# Patient Record
Sex: Male | Born: 1955 | Race: White | Hispanic: No | Smoking: Never smoker
Health system: Southern US, Community
[De-identification: ages and names within clinical notes are randomized; demographics above are authoritative.]

## PROBLEM LIST (undated history)

## (undated) DIAGNOSIS — M4800 Spinal stenosis, site unspecified: Secondary | ICD-10-CM

## (undated) DIAGNOSIS — F419 Anxiety disorder, unspecified: Secondary | ICD-10-CM

## (undated) DIAGNOSIS — C19 Malignant neoplasm of rectosigmoid junction: Secondary | ICD-10-CM

## (undated) DIAGNOSIS — E785 Hyperlipidemia, unspecified: Secondary | ICD-10-CM

## (undated) DIAGNOSIS — I10 Essential (primary) hypertension: Secondary | ICD-10-CM

## (undated) DIAGNOSIS — Z8619 Personal history of other infectious and parasitic diseases: Secondary | ICD-10-CM

## (undated) DIAGNOSIS — K6282 Dysplasia of anus: Secondary | ICD-10-CM

## (undated) DIAGNOSIS — F329 Major depressive disorder, single episode, unspecified: Secondary | ICD-10-CM

## (undated) DIAGNOSIS — F32A Depression, unspecified: Secondary | ICD-10-CM

## (undated) DIAGNOSIS — R972 Elevated prostate specific antigen [PSA]: Secondary | ICD-10-CM

## (undated) HISTORY — DX: Elevated prostate specific antigen (PSA): R97.20

## (undated) HISTORY — DX: Anxiety disorder, unspecified: F41.9

## (undated) HISTORY — DX: Depression, unspecified: F32.A

## (undated) HISTORY — DX: Dysplasia of anus: K62.82

## (undated) HISTORY — PX: BACK SURGERY: SHX140

## (undated) HISTORY — DX: Malignant neoplasm of rectosigmoid junction: C19

## (undated) HISTORY — DX: Personal history of other infectious and parasitic diseases: Z86.19

## (undated) HISTORY — DX: Essential (primary) hypertension: I10

## (undated) HISTORY — DX: Spinal stenosis, site unspecified: M48.00

## (undated) HISTORY — PX: HERNIA REPAIR: SHX51

## (undated) HISTORY — PX: COLONOSCOPY: SHX174

## (undated) HISTORY — DX: Hyperlipidemia, unspecified: E78.5

## (undated) HISTORY — PX: RECTAL BIOPSY: SHX2303

## (undated) HISTORY — DX: Major depressive disorder, single episode, unspecified: F32.9

---

## 1975-07-03 HISTORY — PX: TONSILLECTOMY: SUR1361

## 2010-11-17 ENCOUNTER — Other Ambulatory Visit (HOSPITAL_COMMUNITY): Payer: Self-pay | Admitting: Neurological Surgery

## 2010-11-17 DIAGNOSIS — M48061 Spinal stenosis, lumbar region without neurogenic claudication: Secondary | ICD-10-CM

## 2010-12-01 ENCOUNTER — Ambulatory Visit (HOSPITAL_COMMUNITY)
Admission: RE | Admit: 2010-12-01 | Discharge: 2010-12-01 | Disposition: A | Discharge status: Home / Self Care | Payer: BC Managed Care – PPO | Source: Ambulatory Visit | Attending: Neurological Surgery | Admitting: Neurological Surgery

## 2010-12-01 DIAGNOSIS — M48061 Spinal stenosis, lumbar region without neurogenic claudication: Secondary | ICD-10-CM

## 2010-12-01 DIAGNOSIS — M549 Dorsalgia, unspecified: Secondary | ICD-10-CM | POA: Insufficient documentation

## 2010-12-01 DIAGNOSIS — M519 Unspecified thoracic, thoracolumbar and lumbosacral intervertebral disc disorder: Secondary | ICD-10-CM | POA: Insufficient documentation

## 2010-12-01 MED ORDER — IOHEXOL 180 MG/ML  SOLN
14.0000 mL | Freq: Once | INTRAMUSCULAR | Status: AC | PRN
  Administered 2010-12-01: 14 mL via INTRATHECAL

## 2011-01-25 ENCOUNTER — Encounter: Payer: Self-pay | Admitting: *Deleted

## 2011-01-26 ENCOUNTER — Encounter: Payer: Self-pay | Admitting: Internal Medicine

## 2011-01-26 ENCOUNTER — Ambulatory Visit (INDEPENDENT_AMBULATORY_CARE_PROVIDER_SITE_OTHER): Payer: BC Managed Care – PPO | Admitting: Internal Medicine

## 2011-01-26 VITALS — BP 112/78 | HR 72 | Temp 98.6°F | Resp 14 | Ht 69.0 in | Wt 176.0 lb

## 2011-01-26 DIAGNOSIS — M069 Rheumatoid arthritis, unspecified: Secondary | ICD-10-CM

## 2011-01-26 DIAGNOSIS — M479 Spondylosis, unspecified: Secondary | ICD-10-CM

## 2011-01-26 DIAGNOSIS — M79605 Pain in left leg: Secondary | ICD-10-CM | POA: Insufficient documentation

## 2011-01-26 DIAGNOSIS — Z0000 Encounter for general adult medical examination without abnormal findings: Secondary | ICD-10-CM

## 2011-01-26 DIAGNOSIS — M48061 Spinal stenosis, lumbar region without neurogenic claudication: Secondary | ICD-10-CM

## 2011-01-26 DIAGNOSIS — M545 Low back pain: Secondary | ICD-10-CM | POA: Insufficient documentation

## 2011-01-26 DIAGNOSIS — F329 Major depressive disorder, single episode, unspecified: Secondary | ICD-10-CM | POA: Insufficient documentation

## 2011-01-26 DIAGNOSIS — F341 Dysthymic disorder: Secondary | ICD-10-CM

## 2011-01-26 LAB — CBC WITH DIFFERENTIAL/PLATELET
Basophils Relative: 0.4 % (ref 0.0–3.0)
Eosinophils Absolute: 0.1 10*3/uL (ref 0.0–0.7)
MCHC: 33.9 g/dL (ref 30.0–36.0)
MCV: 94.3 fl (ref 78.0–100.0)
Monocytes Absolute: 0.4 10*3/uL (ref 0.1–1.0)
Neutrophils Relative %: 54.7 % (ref 43.0–77.0)
Platelets: 152 10*3/uL (ref 150.0–400.0)
RBC: 4.68 Mil/uL (ref 4.22–5.81)

## 2011-01-26 LAB — POCT URINALYSIS DIPSTICK
Bilirubin, UA: NEGATIVE
Ketones, UA: NEGATIVE
Leukocytes, UA: NEGATIVE
Spec Grav, UA: 1.01
pH, UA: 7

## 2011-01-26 LAB — BASIC METABOLIC PANEL
BUN: 14 mg/dL (ref 6–23)
CO2: 29 mEq/L (ref 19–32)
Chloride: 105 mEq/L (ref 96–112)
Creatinine, Ser: 0.7 mg/dL (ref 0.4–1.5)
Glucose, Bld: 77 mg/dL (ref 70–99)

## 2011-01-26 LAB — HEPATIC FUNCTION PANEL
AST: 18 U/L (ref 0–37)
Albumin: 4.5 g/dL (ref 3.5–5.2)
Alkaline Phosphatase: 63 U/L (ref 39–117)
Bilirubin, Direct: 0.1 mg/dL (ref 0.0–0.3)
Total Protein: 7.3 g/dL (ref 6.0–8.3)

## 2011-01-26 LAB — LIPID PANEL
Total CHOL/HDL Ratio: 3
Triglycerides: 97 mg/dL (ref 0.0–149.0)

## 2011-01-26 NOTE — Progress Notes (Signed)
Subjective:    Patient ID: Eric Dine., male    DOB: 01-27-1956, 55 y.o.   MRN: [id number]  HPI The patient is a 55 year old white male who presents to establish longitudinal internal medicine care. He lays a recent history of treatment for anxiety with depression approximately a year ago he began treatment with Cymbalta for stress from a move in a job change.  He came to [place] to give care for his parents his father is recently deceased and he is still adjusting to the location and his caregiver role.  He has a more extensive history of low back pain with a working diagnosis of spinal stenosis.  Apparently he has seen both a neurosurgeon and a neurologist at [facility] as well as a local neurosurgeon Dr. Danielle Dess. At his evaluation they discussed that he had severe arthritis of the spine and lumbar region with spinal stenosis it was not considered operative he failed to nerve blocks and there was a discussion of a more definitive procedure. He has a distant history of rheumatoid arthritis and no followup after a diagnosis approximately 4 years ago.     Review of Systems  Constitutional: Negative for fever and fatigue.  HENT: Negative for hearing loss, congestion, neck pain and postnasal drip.   Eyes: Negative for discharge, redness and visual disturbance.  Respiratory: Negative for cough, shortness of breath and wheezing.   Cardiovascular: Negative for leg swelling.  Gastrointestinal: Negative for abdominal pain, constipation and abdominal distention.  Genitourinary: Negative for urgency and frequency.  Musculoskeletal: Positive for myalgias, back pain and arthralgias. Negative for joint swelling.  Skin: Negative for color change and rash.  Neurological: Negative for weakness and light-headedness.  Hematological: Negative for adenopathy.  Psychiatric/Behavioral: Negative for behavioral problems.   Past Medical History  Diagnosis Date  . History of chicken pox   . Spinal  stenosis   . Depression     reactive   Past Surgical History  Procedure Date  . Hernia repair   . Tonsillectomy 1977    reports that he has never smoked. He does not have any smokeless tobacco history on file. He reports that he drinks alcohol. His drug history not on file. family history includes Arthritis in his father and mother; Diabetes in his father; Heart disease in his father; Hyperlipidemia in his father; and Hypertension in his father. No Known Allergies     Objective:   Physical Exam  Nursing note and vitals reviewed. Constitutional: He appears well-developed and well-nourished.  HENT:  Head: Normocephalic and atraumatic.  Eyes: Conjunctivae are normal. Pupils are equal, round, and reactive to light.  Neck: Normal range of motion. Neck supple.  Cardiovascular: Normal rate and regular rhythm.   Pulmonary/Chest: Effort normal and breath sounds normal.  Abdominal: Soft. Bowel sounds are normal.          Assessment & Plan:   I am concerned that his back pain may be more complicated than simply osteoarthritis with spinal stenosis given the history of a positive rheumatoid factor that has been on treatment and also the history that he responded to steroid treatment with excellent control of his back pain I am wondering if this may represent a mixed connective tissue or an autoimmune disorder.  To that end we will draw a HLA-B27 and a circulated C-peptide antibody I will review the MRI to see if there is any evidence of ankylosis in his back we'll consider a rheumatological referral.  He will be scheduled for a physical  in approximately one month physical labs were drawn today

## 2011-01-29 LAB — CYCLIC CITRUL PEPTIDE ANTIBODY, IGG: Cyclic Citrullin Peptide Ab: <2 U/mL (ref 0.0–5.0)

## 2011-02-07 ENCOUNTER — Ambulatory Visit (INDEPENDENT_AMBULATORY_CARE_PROVIDER_SITE_OTHER): Payer: BC Managed Care – PPO | Admitting: Internal Medicine

## 2011-02-07 ENCOUNTER — Encounter: Payer: Self-pay | Admitting: Internal Medicine

## 2011-02-07 VITALS — BP 130/78 | HR 80 | Temp 98.2°F | Resp 16 | Ht 69.0 in | Wt 175.0 lb

## 2011-02-07 DIAGNOSIS — Z0000 Encounter for general adult medical examination without abnormal findings: Secondary | ICD-10-CM

## 2011-02-07 DIAGNOSIS — Z23 Encounter for immunization: Secondary | ICD-10-CM

## 2011-02-07 DIAGNOSIS — M549 Dorsalgia, unspecified: Secondary | ICD-10-CM

## 2011-02-07 NOTE — Progress Notes (Signed)
  Subjective:    Patient ID: Eric Parks., male    DOB: [birth year], 55 y.o.   MRN: [id number]  HPI Patient is a 55 year old white male presents for complete physical examination.  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  He has not been treated for rheumatoid arthritis he has been treated with back injections after being seen by a neurologist at [facility] and referral to their back pain center.     Review of Systems  Constitutional: Negative for fever and fatigue.  HENT: Negative for hearing loss, congestion, neck pain and postnasal drip.   Eyes: Negative for discharge, redness and visual disturbance.  Respiratory: Negative for cough, shortness of breath and wheezing.   Cardiovascular: Negative for leg swelling.  Gastrointestinal: Negative for abdominal pain, constipation and abdominal distention.  Genitourinary: Negative for urgency and frequency.  Musculoskeletal: Positive for back pain. Negative for joint swelling and arthralgias.  Skin: Negative for color change and rash.  Neurological: Negative for weakness and light-headedness.  Hematological: Negative for adenopathy.  Psychiatric/Behavioral: Negative for behavioral problems.       Past Medical History  Diagnosis Date  . History of chicken pox   . Spinal stenosis   . Depression     reactive   Past Surgical History  Procedure Date  . Hernia repair   . Tonsillectomy 1977    reports that he has never smoked. He does not have any smokeless tobacco history on file. He reports that he drinks alcohol. His drug history not on file. family history includes Arthritis in his father and mother; Diabetes in his father; Heart disease in his father; Hyperlipidemia in his father; and Hypertension in his father. No Known Allergies  Objective:   Physical Exam  Nursing note and vitals  reviewed. Constitutional: He is oriented to person, place, and time. He appears well-developed and well-nourished.  HENT:  Head: Normocephalic and atraumatic.  Eyes: Conjunctivae are normal. Pupils are equal, round, and reactive to light.  Neck: Normal range of motion. Neck supple.  Cardiovascular: Normal rate and regular rhythm.   Pulmonary/Chest: Effort normal and breath sounds normal.  Abdominal: Soft. Bowel sounds are normal.  Musculoskeletal: Normal range of motion.       Lack of natural curvature of the back with lordosis  Neurological: He is alert and oriented to person, place, and time.  Skin: Skin is warm and dry.          Assessment & Plan:   Patient presents for yearly preventative medicine examination.   all immunizations and health maintenance protocols were reviewed with the patient and they are up to date with these protocols.   screening laboratory values were reviewed with the patient including screening of hyperlipidemia PSA renal function and hepatic function.   There medications past medical history social history problem list and allergies were reviewed in detail.   Goals were established with regard to weight loss exercise diet in compliance with medications   Referral for physical therapy after failure of his paraspinal injections for pain control he does have marked lordosis and bad posture of believe that training and physical therapy will be effective as any medical intervention.

## 2011-02-20 ENCOUNTER — Encounter: Payer: Self-pay | Admitting: Internal Medicine

## 2011-03-06 ENCOUNTER — Encounter: Payer: Self-pay | Admitting: Internal Medicine

## 2011-05-11 ENCOUNTER — Encounter: Payer: Self-pay | Admitting: Internal Medicine

## 2011-05-11 ENCOUNTER — Ambulatory Visit (INDEPENDENT_AMBULATORY_CARE_PROVIDER_SITE_OTHER): Payer: BC Managed Care – PPO | Admitting: Internal Medicine

## 2011-05-11 VITALS — BP 130/80 | HR 76 | Temp 98.2°F | Resp 16 | Ht 69.0 in | Wt 178.0 lb

## 2011-05-11 DIAGNOSIS — F329 Major depressive disorder, single episode, unspecified: Secondary | ICD-10-CM

## 2011-05-11 DIAGNOSIS — M48061 Spinal stenosis, lumbar region without neurogenic claudication: Secondary | ICD-10-CM

## 2011-05-11 DIAGNOSIS — M479 Spondylosis, unspecified: Secondary | ICD-10-CM

## 2011-05-11 DIAGNOSIS — F341 Dysthymic disorder: Secondary | ICD-10-CM

## 2011-05-11 NOTE — Progress Notes (Signed)
  Subjective:    Patient ID: Eric Dine., male    DOB: [birth year], 55 y.o.   MRN: [id number]  HPI Has seen neurosurgeons ( [facility]) and has L4-5 left discuss protrussion Has tried PT without relief at [facility]...  Now has daily pain The surgeon has discussed surgical treatment options    Review of Systems  Constitutional: Negative for fever and fatigue.  HENT: Negative for hearing loss, congestion, neck pain and postnasal drip.   Eyes: Negative for discharge, redness and visual disturbance.  Respiratory: Negative for cough, shortness of breath and wheezing.   Cardiovascular: Negative for leg swelling.  Gastrointestinal: Negative for abdominal pain, constipation and abdominal distention.  Genitourinary: Negative for urgency and frequency.  Musculoskeletal: Positive for back pain and arthralgias. Negative for joint swelling.  Skin: Negative for color change and rash.  Neurological: Negative for weakness and light-headedness.  Hematological: Negative for adenopathy.  Psychiatric/Behavioral: Negative for behavioral problems.       Objective:   Physical Exam  Nursing note and vitals reviewed. Constitutional: He appears well-developed and well-nourished.  HENT:  Head: Normocephalic and atraumatic.  Eyes: Conjunctivae are normal. Pupils are equal, round, and reactive to light.  Neck: Normal range of motion. Neck supple.  Cardiovascular: Normal rate and regular rhythm.   Pulmonary/Chest: Effort normal and breath sounds normal.  Abdominal: Soft. Bowel sounds are normal.          Assessment & Plan:  The pain was in known disc protrusion that has not responded to conservative therapy.  The patient will choose which route to go with neurosurgery either in [facility] or at a [facility]

## 2011-05-11 NOTE — Patient Instructions (Signed)
The patient is instructed to continue all medications as prescribed. Schedule followup with check out clerk upon leaving the clinic  

## 2011-05-11 NOTE — Assessment & Plan Note (Signed)
Agree with surgical diagnosis and would recommend surgery The pt will decide

## 2011-07-09 ENCOUNTER — Telehealth: Payer: Self-pay | Admitting: *Deleted

## 2011-07-09 DIAGNOSIS — M5410 Radiculopathy, site unspecified: Secondary | ICD-10-CM

## 2011-07-09 NOTE — Telephone Encounter (Signed)
Per dr Lovell Sheehan- he has heard she was good

## 2011-07-09 NOTE — Telephone Encounter (Signed)
Needs referral to this MD. [phone]

## 2011-07-09 NOTE — Telephone Encounter (Signed)
Pt. Has heard from a friend that Dr. Stasia Cavalier in [facility] is an excellent neurosurgeon, and wants Dr. Lovell Sheehan opinion.

## 2011-07-09 NOTE — Telephone Encounter (Signed)
May give referral for radicular pain per dr Lovell Sheehan

## 2011-08-10 ENCOUNTER — Ambulatory Visit (INDEPENDENT_AMBULATORY_CARE_PROVIDER_SITE_OTHER): Payer: BC Managed Care – PPO | Admitting: Internal Medicine

## 2011-08-10 ENCOUNTER — Encounter: Payer: Self-pay | Admitting: Internal Medicine

## 2011-08-10 VITALS — BP 130/80 | HR 72 | Temp 98.6°F | Resp 16 | Ht 69.0 in | Wt 180.0 lb

## 2011-08-10 DIAGNOSIS — M545 Low back pain, unspecified: Secondary | ICD-10-CM

## 2011-08-10 DIAGNOSIS — M79605 Pain in left leg: Secondary | ICD-10-CM

## 2011-08-10 DIAGNOSIS — M4800 Spinal stenosis, site unspecified: Secondary | ICD-10-CM

## 2011-08-10 NOTE — Patient Instructions (Signed)
The patient is instructed to continue all medications as prescribed. Schedule followup with check out clerk upon leaving the clinic  

## 2011-08-10 NOTE — Progress Notes (Signed)
  Subjective:    Patient ID: Eric Dine., male    DOB: 01-Aug-1955, 56 y.o.   MRN: [id number]  HPI  Increasing pain  Review of Systems  Constitutional: Negative for fever and fatigue.  HENT: Negative for hearing loss, congestion, neck pain and postnasal drip.   Eyes: Negative for discharge, redness and visual disturbance.  Respiratory: Negative for cough, shortness of breath and wheezing.   Cardiovascular: Negative for leg swelling.  Gastrointestinal: Negative for abdominal pain, constipation and abdominal distention.  Genitourinary: Negative for urgency and frequency.  Musculoskeletal: Negative for joint swelling and arthralgias.  Skin: Negative for color change and rash.  Neurological: Negative for weakness and light-headedness.  Hematological: Negative for adenopathy.  Psychiatric/Behavioral: Negative for behavioral problems.   Past Medical History  Diagnosis Date  . History of chicken pox   . Spinal stenosis   . Depression     reactive    History   Social History  . Marital Status: Single    Spouse Name: N/A    Number of Children: N/A  . Years of Education: N/A   Occupational History  . Clinical research associate    Social History Main Topics  . Smoking status: Never Smoker   . Smokeless tobacco: Not on file  . Alcohol Use: Yes  . Drug Use: Not on file  . Sexually Active: Yes   Other Topics Concern  . Not on file   Social History Narrative  . No narrative on file    Past Surgical History  Procedure Date  . Hernia repair   . Tonsillectomy 1977    Family History  Problem Relation Age of Onset  . Arthritis Father   . Hyperlipidemia Father   . Heart disease Father   . Hypertension Father   . Diabetes Father   . Arthritis Mother     No Known Allergies  Current Outpatient Prescriptions on File Prior to Visit  Medication Sig Dispense Refill  . DULoxetine (CYMBALTA) 60 MG capsule Take 60 mg by mouth daily.          BP 130/80  Pulse 72  Temp 98.6 F (37 C)   Resp 16  Ht 5\' 9"  (1.753 m)  Wt 180 lb (81.647 kg)  BMI 26.58 kg/m2       Objective:   Physical Exam  Nursing note reviewed. Constitutional: He appears well-developed and well-nourished.  HENT:  Head: Normocephalic and atraumatic.  Eyes: Conjunctivae are normal. Pupils are equal, round, and reactive to light.  Neck: Normal range of motion. Neck supple.  Cardiovascular: Normal rate and regular rhythm.   Pulmonary/Chest: Effort normal and breath sounds normal.  Abdominal: Soft. Bowel sounds are normal.          Assessment & Plan:  He is scheduled for February 27 Surgery in [facility].  Hopefully this will relieve his chronic lumbar pain.

## 2011-10-10 ENCOUNTER — Ambulatory Visit: Payer: BC Managed Care – PPO | Admitting: Internal Medicine

## 2011-10-10 ENCOUNTER — Ambulatory Visit: Payer: BC Managed Care – PPO | Attending: Neurosurgery | Admitting: *Deleted

## 2011-12-03 ENCOUNTER — Telehealth: Payer: Self-pay

## 2011-12-03 MED ORDER — DULOXETINE HCL 30 MG PO CPEP: 30.0000 mg | ORAL_CAPSULE | Freq: Every day | ORAL | Status: DC

## 2011-12-03 NOTE — Telephone Encounter (Signed)
Pt has been taking Cymbalta 60mg  for years, but wants to stop it.  He wants to know how to taper off.  Last time he was here he says you discussed with him possibly stopping it since he has been on it for so long that it is probably not as effective as it used to be.

## 2011-12-03 NOTE — Telephone Encounter (Signed)
Per dr Lovell Sheehan- may go on 30 mg for 3 weeks and then stop

## 2012-01-14 ENCOUNTER — Telehealth: Payer: Self-pay | Admitting: Family Medicine

## 2012-01-14 MED ORDER — TRAMADOL HCL 50 MG PO TABS: 50.0000 mg | ORAL_TABLET | Freq: Four times a day (QID) | ORAL | Status: DC | PRN

## 2012-01-14 NOTE — Telephone Encounter (Signed)
Pt had back surgery March 2013 in [place]. Since surgery was in [facility], they state he has been released by their practice, and he should have med management done by Dr. Lovell Sheehan. He was Rx'd Oxycodone by neurosurgeon, as well as Ibuprofen 800mg . One's a narcotic, the other he fears liver damage, so he would like to know if Tramadol a possibility? Non-narcotic, which he prefers. Can he try this on days when pain is particularly bad? Oxy is kind of strong, and leaves him feeling a little "spacy in head". He does not want to take that long term. Please call pt to advise.

## 2012-01-14 NOTE — Telephone Encounter (Signed)
Per dr Lovell Sheehan- may have ultram 50 1-2 q6-8 hrs prn pain

## 2012-03-06 ENCOUNTER — Other Ambulatory Visit: Payer: Self-pay | Admitting: *Deleted

## 2012-03-06 ENCOUNTER — Telehealth: Payer: Self-pay | Admitting: Family Medicine

## 2012-03-06 MED ORDER — TRAMADOL HCL 50 MG PO TABS: 50.0000 mg | ORAL_TABLET | Freq: Four times a day (QID) | ORAL | Status: DC | PRN

## 2012-03-06 NOTE — Telephone Encounter (Signed)
This was taken care of today

## 2012-03-06 NOTE — Telephone Encounter (Signed)
Pt called about Tramadol refill. He is on Tramadol 50mg . He used to get filled at [facility] on [address]. He now will be getting it through [facility] mail order. [facility] had him fill out a form and mail it to our office. Pt mailed it 8/28 and put ATTN Dr. Darryll Capers on the mail, but I don't know if we ever got it. Please send in his Tramadol 50mg  to [facility] mail order. Call pt if there is any issue. He does not take the med every day, just as needed. He has 5 pills left. Thanks.   Pt's ID# for [facility] mail order is:  [id number]

## 2012-04-30 ENCOUNTER — Telehealth: Payer: Self-pay | Admitting: Internal Medicine

## 2012-04-30 NOTE — Telephone Encounter (Signed)
Pt called and has spinal surgery in March 2013. Pt is seeing Dr Charmayne Sheer at [facility] in [facility] re: pain from surgery, and was given some diagnostic shots and has discussed pt going on Enbrel. Pt is req work in ov with Dr Lovell Sheehan to discuss the excruciating and debilitating pain. Pt refused to see another doctor.

## 2012-04-30 NOTE — Telephone Encounter (Signed)
Per dr Lovell Sheehan- needs to see notes from dr Maple Hudson- pt informed

## 2012-05-05 ENCOUNTER — Ambulatory Visit: Payer: BC Managed Care – PPO | Admitting: Internal Medicine

## 2012-05-13 DIAGNOSIS — M961 Postlaminectomy syndrome, not elsewhere classified: Secondary | ICD-10-CM | POA: Insufficient documentation

## 2012-06-01 ENCOUNTER — Other Ambulatory Visit: Payer: Self-pay | Admitting: Internal Medicine

## 2012-07-14 ENCOUNTER — Other Ambulatory Visit: Payer: BC Managed Care – PPO

## 2012-07-15 ENCOUNTER — Other Ambulatory Visit (INDEPENDENT_AMBULATORY_CARE_PROVIDER_SITE_OTHER): Payer: BC Managed Care – PPO

## 2012-07-15 DIAGNOSIS — Z0000 Encounter for general adult medical examination without abnormal findings: Secondary | ICD-10-CM

## 2012-07-15 LAB — POCT URINALYSIS DIPSTICK
Bilirubin, UA: NEGATIVE
Ketones, UA: NEGATIVE
Leukocytes, UA: NEGATIVE

## 2012-07-15 LAB — CBC WITH DIFFERENTIAL/PLATELET
Basophils Absolute: 0 10*3/uL (ref 0.0–0.1)
Basophils Relative: 0.5 % (ref 0.0–3.0)
Eosinophils Absolute: 0.1 10*3/uL (ref 0.0–0.7)
Hemoglobin: 14.2 g/dL (ref 13.0–17.0)
Lymphocytes Relative: 36.3 % (ref 12.0–46.0)
MCHC: 33.9 g/dL (ref 30.0–36.0)
MCV: 93.1 fl (ref 78.0–100.0)
Monocytes Absolute: 0.4 10*3/uL (ref 0.1–1.0)
Neutro Abs: 2.7 10*3/uL (ref 1.4–7.7)
RDW: 13.9 % (ref 11.5–14.6)

## 2012-07-15 LAB — HEPATIC FUNCTION PANEL
Albumin: 4.3 g/dL (ref 3.5–5.2)
Alkaline Phosphatase: 59 U/L (ref 39–117)
Total Protein: 6.9 g/dL (ref 6.0–8.3)

## 2012-07-15 LAB — LIPID PANEL
Cholesterol: 200 mg/dL (ref 0–200)
HDL: 53.2 mg/dL (ref >39.00)
Triglycerides: 155 mg/dL — ABNORMAL HIGH (ref 0.0–149.0)
VLDL: 31 mg/dL (ref 0.0–40.0)

## 2012-07-15 LAB — BASIC METABOLIC PANEL
CO2: 28 mEq/L (ref 19–32)
Calcium: 9.3 mg/dL (ref 8.4–10.5)
Chloride: 105 mEq/L (ref 96–112)
Creatinine, Ser: 0.7 mg/dL (ref 0.4–1.5)
Glucose, Bld: 101 mg/dL — ABNORMAL HIGH (ref 70–99)

## 2012-07-21 ENCOUNTER — Encounter: Payer: Self-pay | Admitting: Internal Medicine

## 2012-07-21 ENCOUNTER — Ambulatory Visit (INDEPENDENT_AMBULATORY_CARE_PROVIDER_SITE_OTHER): Payer: BC Managed Care – PPO | Admitting: Internal Medicine

## 2012-07-21 VITALS — BP 130/76 | HR 72 | Temp 98.2°F | Resp 16 | Ht 69.0 in | Wt 180.0 lb

## 2012-07-21 DIAGNOSIS — M79605 Pain in left leg: Secondary | ICD-10-CM

## 2012-07-21 DIAGNOSIS — M545 Low back pain, unspecified: Secondary | ICD-10-CM

## 2012-07-21 DIAGNOSIS — Z0000 Encounter for general adult medical examination without abnormal findings: Secondary | ICD-10-CM

## 2012-07-21 DIAGNOSIS — M4800 Spinal stenosis, site unspecified: Secondary | ICD-10-CM

## 2012-07-21 DIAGNOSIS — F329 Major depressive disorder, single episode, unspecified: Secondary | ICD-10-CM

## 2012-07-21 DIAGNOSIS — F341 Dysthymic disorder: Secondary | ICD-10-CM

## 2012-07-21 DIAGNOSIS — M533 Sacrococcygeal disorders, not elsewhere classified: Secondary | ICD-10-CM

## 2012-07-21 MED ORDER — METHOTREXATE SODIUM 2.5 MG PO TABS: 7.5000 mg | ORAL_TABLET | ORAL | Status: DC

## 2012-07-21 MED ORDER — TRAMADOL HCL 50 MG PO TABS: 50.0000 mg | ORAL_TABLET | Freq: Three times a day (TID) | ORAL | Status: DC | PRN

## 2012-07-21 NOTE — Progress Notes (Signed)
Subjective:    Patient ID: Eric Parks., male    DOB: 10-06-1955, 57 y.o.   MRN: [id number]  HPI Significant pain in the lower back with radicular pattern Neurology suggested Anklosing Spondylitis     Review of Systems  Constitutional: Negative for fever and fatigue.  HENT: Negative for hearing loss, congestion, neck pain and postnasal drip.   Eyes: Negative for discharge, redness and visual disturbance.  Respiratory: Negative for cough, shortness of breath and wheezing.   Cardiovascular: Negative for leg swelling.  Gastrointestinal: Negative for abdominal pain, constipation and abdominal distention.  Genitourinary: Negative for urgency and frequency.  Musculoskeletal: Positive for back pain, arthralgias and gait problem. Negative for joint swelling.  Skin: Negative for color change and rash.  Neurological: Positive for weakness. Negative for light-headedness.  Hematological: Negative for adenopathy.  Psychiatric/Behavioral: Negative for behavioral problems.   Past Medical History  Diagnosis Date  . History of chicken pox   . Spinal stenosis   . Depression     reactive    History   Social History  . Marital Status: Single    Spouse Name: N/A    Number of Children: N/A  . Years of Education: N/A   Occupational History  . Clinical research associate    Social History Main Topics  . Smoking status: Never Smoker   . Smokeless tobacco: Not on file  . Alcohol Use: Yes  . Drug Use: Not on file  . Sexually Active: Yes   Other Topics Concern  . Not on file   Social History Narrative  . No narrative on file    Past Surgical History  Procedure Date  . Hernia repair   . Tonsillectomy 1977    Family History  Problem Relation Age of Onset  . Arthritis Father   . Hyperlipidemia Father   . Heart disease Father   . Hypertension Father   . Diabetes Father   . Arthritis Mother     No Known Allergies  Current Outpatient Prescriptions on File Prior to Visit  Medication  Sig Dispense Refill  . aspirin 81 MG tablet Take 160 mg by mouth daily.      . traMADol (ULTRAM) 50 MG tablet TAKE 1 TABLET EVERY 6 HOURS AS NEEDED FOR PAIN  50 tablet  0    BP 130/76  Pulse 72  Temp 98.2 F (36.8 C)  Resp 16  Ht 5\' 9"  (1.753 m)  Wt 180 lb (81.647 kg)  BMI 26.58 kg/m2        Objective:   Physical Exam  Nursing note and vitals reviewed. Constitutional: He appears well-developed and well-nourished.  HENT:  Head: Normocephalic and atraumatic.  Eyes: Conjunctivae normal are normal. Pupils are equal, round, and reactive to light.  Neck: Normal range of motion. Neck supple.  Cardiovascular: Normal rate and regular rhythm.   Pulmonary/Chest: Effort normal and breath sounds normal.  Abdominal: Soft. Bowel sounds are normal.  Musculoskeletal: He exhibits tenderness.  Neurological: He displays abnormal reflex. He exhibits abnormal muscle tone. Coordination abnormal.          Assessment & Plan:   Patient presents for yearly preventative medicine examination.   all immunizations and health maintenance protocols were reviewed with the patient and they are up to date with these protocols.   screening laboratory values were reviewed with the patient including screening of hyperlipidemia PSA renal function and hepatic function.   There medications past medical history social history problem list and allergies were reviewed in detail.  Goals were established with regard to weight loss exercise diet in compliance with medications  Discussion of nerve ablation therapy vs embrel injections? Referral to Rheumatology for evaluation Possible AS vs RA Trial of Methotrexate if has response consider Embrel?

## 2012-07-21 NOTE — Patient Instructions (Addendum)
Doing blood work for rheumatoid arthritis and ankylosing spondylitis he can long on to my chart in see the blood work in about 4 or 5 days I have referred you to [facility] for rheumatology

## 2012-07-31 ENCOUNTER — Telehealth: Payer: Self-pay | Admitting: Internal Medicine

## 2012-07-31 ENCOUNTER — Encounter: Payer: Self-pay | Admitting: Internal Medicine

## 2012-07-31 DIAGNOSIS — Z139 Encounter for screening, unspecified: Secondary | ICD-10-CM

## 2012-07-31 NOTE — Telephone Encounter (Signed)
Patient called stating that he would like a referral to have a colonoscopy. Please assist.

## 2012-08-01 ENCOUNTER — Encounter: Payer: Self-pay | Admitting: Gastroenterology

## 2012-08-01 NOTE — Telephone Encounter (Signed)
Ok per Dr Jenkins, referral order placed 

## 2012-08-06 ENCOUNTER — Telehealth: Payer: Self-pay | Admitting: Internal Medicine

## 2012-08-06 NOTE — Telephone Encounter (Signed)
Pt would explanation of blood work results.

## 2012-08-06 NOTE — Telephone Encounter (Signed)
Pt informed per dr Lovell Sheehan- that all was neg ,but that is not a def answer- still wants him to go to [facility]

## 2012-09-04 ENCOUNTER — Encounter: Payer: BC Managed Care – PPO | Admitting: Gastroenterology

## 2012-10-20 ENCOUNTER — Ambulatory Visit (INDEPENDENT_AMBULATORY_CARE_PROVIDER_SITE_OTHER): Payer: BC Managed Care – PPO | Admitting: Internal Medicine

## 2012-10-20 ENCOUNTER — Encounter: Payer: Self-pay | Admitting: Internal Medicine

## 2012-10-20 VITALS — BP 134/74 | HR 76 | Temp 98.3°F | Resp 16 | Ht 69.0 in | Wt 180.0 lb

## 2012-10-20 DIAGNOSIS — F411 Generalized anxiety disorder: Secondary | ICD-10-CM

## 2012-10-20 DIAGNOSIS — F419 Anxiety disorder, unspecified: Secondary | ICD-10-CM

## 2012-10-20 DIAGNOSIS — M48061 Spinal stenosis, lumbar region without neurogenic claudication: Secondary | ICD-10-CM

## 2012-10-20 DIAGNOSIS — M159 Polyosteoarthritis, unspecified: Secondary | ICD-10-CM

## 2012-10-20 MED ORDER — CLORAZEPATE DIPOTASSIUM 7.5 MG PO TABS: 7.5000 mg | ORAL_TABLET | Freq: Two times a day (BID) | ORAL | Status: DC | PRN

## 2012-10-20 NOTE — Patient Instructions (Addendum)
Take at least one Celebrex daily and a second Celebrex as needed for pain

## 2012-10-20 NOTE — Progress Notes (Signed)
Subjective:    Patient ID: Eric Longest., male    DOB: 1955-11-18, 57 y.o.   MRN: [id number]  HPI Had life line screening and had no evidence for aneurysmal disease peripheral vascular disease osteoporosis or  for carotid stenosis.  Chronic back problems Had  Been evaluated for ankylosing spondylosis Patient does have spondylolisthesis    Review of Systems  Constitutional: Negative for fever and fatigue.  HENT: Negative for hearing loss, congestion, neck pain and postnasal drip.   Eyes: Negative for discharge, redness and visual disturbance.  Respiratory: Negative for cough, shortness of breath and wheezing.   Cardiovascular: Negative for leg swelling.  Gastrointestinal: Negative for abdominal pain, constipation and abdominal distention.  Genitourinary: Negative for urgency and frequency.  Musculoskeletal: Negative for joint swelling and arthralgias.  Skin: Negative for color change and rash.  Neurological: Negative for weakness and light-headedness.  Hematological: Negative for adenopathy.  Psychiatric/Behavioral: Negative for behavioral problems.   Past Medical History  Diagnosis Date  . History of chicken pox   . Spinal stenosis   . Depression     reactive    History   Social History  . Marital Status: Single    Spouse Name: N/A    Number of Children: N/A  . Years of Education: N/A   Occupational History  . Clinical research associate    Social History Main Topics  . Smoking status: Never Smoker   . Smokeless tobacco: Not on file  . Alcohol Use: Yes  . Drug Use: Not on file  . Sexually Active: Yes   Other Topics Concern  . Not on file   Social History Narrative  . No narrative on file    Past Surgical History  Procedure Laterality Date  . Hernia repair    . Tonsillectomy  1977    Family History  Problem Relation Age of Onset  . Arthritis Father   . Hyperlipidemia Father   . Heart disease Father   . Hypertension Father   . Diabetes Father   . Arthritis  Mother     No Known Allergies  Current Outpatient Prescriptions on File Prior to Visit  Medication Sig Dispense Refill  . aspirin 81 MG tablet Take 160 mg by mouth daily.      Marland Kitchen b complex vitamins tablet Take 1 tablet by mouth daily.      . Multiple Vitamin (MULTIVITAMIN) tablet Take 1 tablet by mouth daily.      . traMADol (ULTRAM) 50 MG tablet Take 1 tablet (50 mg total) by mouth every 8 (eight) hours as needed for pain.  150 tablet  0   No current facility-administered medications on file prior to visit.    BP 134/74  Pulse 76  Temp(Src) 98.3 F (36.8 C)  Resp 16  Ht 5\' 9"  (1.753 m)  Wt 180 lb (81.647 kg)  BMI 26.57 kg/m2       Objective:   Physical Exam  Constitutional: He appears well-developed and well-nourished.  HENT:  Head: Normocephalic and atraumatic.  Eyes: Conjunctivae are normal. Pupils are equal, round, and reactive to light.  Neck: Normal range of motion. Neck supple.  Cardiovascular: Normal rate and regular rhythm.   Pulmonary/Chest: Effort normal and breath sounds normal.  Abdominal: Soft. Bowel sounds are normal.          Assessment & Plan:  No evidence for rheumatic disease including ankylosing spondylitis.  Patient does have spondylolisthesis and chronic arthritic changes of the back that are in the osteoarthritis family.  With resultant spinal canalstenosis  A trial of Celebrex is indicated

## 2013-01-14 ENCOUNTER — Encounter: Payer: Self-pay | Admitting: Internal Medicine

## 2013-01-16 ENCOUNTER — Telehealth: Payer: Self-pay | Admitting: Internal Medicine

## 2013-01-16 MED ORDER — HYDROCODONE-ACETAMINOPHEN 5-325MG PREPACK (~~LOC~~: 1.0000 | ORAL_TABLET | Freq: Four times a day (QID) | ORAL | Status: DC | PRN

## 2013-01-16 NOTE — Telephone Encounter (Signed)
Ok -tp send in per dr Lovell Sheehan

## 2013-01-16 NOTE — Telephone Encounter (Signed)
Patient Information:  Caller Name: Alekxander  Phone: [phone]  Patient: Eric Parks, Eric Parks  Gender: Male  DOB: 1956/03/28  Age: 57 Years  PCP: Darryll Capers (Adults only)  Office Follow Up:  Does the office need to follow up with this patient?: Yes  Instructions For The Office: PLEASE SEE RN NOTE  RN Note:  Pt had Back surgery on 09-01-2011, Pt was given Vicoden for PRN pain w/ history of Spinal Stenosis. Pt called surgeon, MD is no longer at same practice, Pt was offered appt to see new MD.  Pt was given Vicoden in September 2013 and refilled once.  Pt states he only takes w/ bad pain,  takes Celebrex daily.  Pt has been moving tile week of 7-14 and would like refill of Hydrocodone/Acetaminophen 5mg /325mg , Norco. Pt was last seen on 10-20-12, states Dr Lovell Sheehan is aware of back problems.  Pt uses [facility], [phone]. PLEASE DISCUSS W/ DR Lovell Sheehan AND F/U W/ PT.  Symptoms  Reason For Call & Symptoms: Spinal Stenosis F/U, onset 1 year  Reviewed Health History In EMR: Yes  Reviewed Medications In EMR: Yes  Reviewed Allergies In EMR: Yes  Reviewed Surgeries / Procedures: Yes  Date of Onset of Symptoms: 01/13/2013  Treatments Tried: Celebrex  Treatments Tried Worked: No  Guideline(s) Used:  Back Pain  Disposition Per Guideline:   See Within 2 Weeks in Office  Reason For Disposition Reached:   Back pain persists > 2 weeks  Advice Given:  N/A  Patient Will Follow Care Advice:  YES

## 2013-05-07 ENCOUNTER — Other Ambulatory Visit: Payer: Self-pay

## 2013-05-29 ENCOUNTER — Telehealth: Payer: Self-pay | Admitting: Internal Medicine

## 2013-05-29 NOTE — Telephone Encounter (Signed)
Pt request refill of  HYDROcodone-acetaminophen (VICODIN) 5-325 mg TABS #30

## 2013-06-01 MED ORDER — HYDROCODONE-ACETAMINOPHEN 5-325 MG PO TABS: 1.0000 | ORAL_TABLET | Freq: Four times a day (QID) | ORAL | Status: DC | PRN

## 2013-06-01 NOTE — Telephone Encounter (Signed)
Ok to refill but needs to sign contract and be tested

## 2013-06-01 NOTE — Telephone Encounter (Signed)
Rx ready for pick up. Left message on machine for patient to return our call

## 2013-06-03 ENCOUNTER — Other Ambulatory Visit: Payer: Self-pay | Admitting: *Deleted

## 2013-06-03 NOTE — Telephone Encounter (Signed)
Pt notified ready for pickup.

## 2013-06-08 ENCOUNTER — Encounter: Payer: Self-pay | Admitting: Internal Medicine

## 2013-07-24 ENCOUNTER — Encounter: Payer: Self-pay | Admitting: Internal Medicine

## 2013-08-10 ENCOUNTER — Other Ambulatory Visit: Payer: Self-pay | Admitting: *Deleted

## 2013-08-10 ENCOUNTER — Encounter: Payer: Self-pay | Admitting: Internal Medicine

## 2013-08-10 DIAGNOSIS — Z1211 Encounter for screening for malignant neoplasm of colon: Secondary | ICD-10-CM

## 2013-08-24 ENCOUNTER — Other Ambulatory Visit: Payer: Self-pay | Admitting: *Deleted

## 2013-08-24 DIAGNOSIS — Z1211 Encounter for screening for malignant neoplasm of colon: Secondary | ICD-10-CM

## 2013-09-24 ENCOUNTER — Encounter: Payer: Self-pay | Admitting: Gastroenterology

## 2013-10-28 ENCOUNTER — Ambulatory Visit (AMBULATORY_SURGERY_CENTER): Payer: BC Managed Care – PPO

## 2013-10-28 VITALS — Ht 68.0 in | Wt 175.0 lb

## 2013-10-28 DIAGNOSIS — Z1211 Encounter for screening for malignant neoplasm of colon: Secondary | ICD-10-CM

## 2013-10-28 MED ORDER — SOD PICOSULFATE-MAG OX-CIT ACD 10-3.5-12 MG-GM-GM PO PACK: 1.0000 | PACK | Freq: Once | ORAL | Status: DC

## 2013-10-28 NOTE — Progress Notes (Signed)
No allergies to eggs or soy. No diet/weight loss meds. No past problems with anesthesia. No home oxygen. Has email. Emmi instructions for colonoscopy.

## 2013-11-02 ENCOUNTER — Encounter: Payer: Self-pay | Admitting: Gastroenterology

## 2013-11-09 ENCOUNTER — Telehealth: Payer: Self-pay | Admitting: Gastroenterology

## 2013-11-09 NOTE — Telephone Encounter (Signed)
Returned patient's call and he was wanting to know if we received his last colonoscopy faxed from [place].  I advised him that it was not with his paperwork or in EMR as yet.  I then called down and spoke with Barb Merino, RN and she said that it was in Dr. Lynne Leader box.  I asked her to put it on dumbwaiter and I would attach to his [place] chart for tomorrow's procedure.  I then told patient that we do have the report.

## 2013-11-10 ENCOUNTER — Ambulatory Visit (AMBULATORY_SURGERY_CENTER): Payer: BC Managed Care – PPO | Admitting: Gastroenterology

## 2013-11-10 ENCOUNTER — Encounter: Payer: Self-pay | Admitting: Gastroenterology

## 2013-11-10 VITALS — BP 118/72 | HR 62 | Temp 97.1°F | Resp 22 | Ht 68.0 in | Wt 175.0 lb

## 2013-11-10 DIAGNOSIS — Z1211 Encounter for screening for malignant neoplasm of colon: Secondary | ICD-10-CM

## 2013-11-10 MED ORDER — SODIUM CHLORIDE 0.9 % IV SOLN: 500.0000 mL | INTRAVENOUS | Status: DC

## 2013-11-10 NOTE — Op Note (Signed)
[facility]  [address]. [place], [address]   COLONOSCOPY PROCEDURE REPORT  PATIENT: Eric Parks, Eric Parks  MR#: [id number] BIRTHDATE: 1956-01-23 , 68  yrs. old GENDER: Male ENDOSCOPIST: Ladene Artist, MD, [facility] REFERRED WN:IOEV Vear Clock, M.D. PROCEDURE DATE:  11/10/2013 PROCEDURE:   Colonoscopy, screening First Screening Colonoscopy - Avg.  risk and is 50 yrs.  old or older - No.  Prior Negative Screening - Now for repeat screening. Other: See Comments  History of Adenoma - Now for follow-up colonoscopy & has been > or = to 3 yrs.  N/A  Polyps Removed Today? No.  Recommend repeat exam, <10 yrs? No. ASA CLASS:   Class II INDICATIONS:average risk screening. MEDICATIONS: MAC sedation, administered by CRNA and propofol (Diprivan) 300mg  IV DESCRIPTION OF PROCEDURE:   After the risks benefits and alternatives of the procedure were thoroughly explained, informed consent was obtained.  A digital rectal exam revealed no abnormalities of the rectum.   The LB [id number]  endoscope was introduced through the anus and advanced to the cecum, which was identified by both the appendix and ileocecal valve. No adverse events experienced.   The quality of the prep was [place] good The instrument was then slowly withdrawn as the colon was fully examined.  COLON FINDINGS: A normal appearing cecum, ileocecal valve, and appendiceal orifice were identified.  The ascending, hepatic flexure, transverse, splenic flexure, descending, sigmoid colon and rectum appeared unremarkable.  No polyps or cancers were seen. Retroflexed views revealed moderate internal hemorrhoids. The time to cecum=3 minutes 30 seconds.  Withdrawal time=13 minutes 15 seconds.  The scope was withdrawn and the procedure completed.  COMPLICATIONS: There were no complications.  ENDOSCOPIC IMPRESSION: 1.  Normal colon 2.  Moderate internal hemorrhoids  RECOMMENDATIONS: 1.  You should continue to  follow colorectal cancer screening guidelines for "routine risk" patients with a repeat colonoscopy in 10 years.  There is no need for routine, screening FOBT (stool) testing for at least 5 years.  eSigned:  Ladene Artist, MD, [facility] 11/10/2013 1:53 PM

## 2013-11-10 NOTE — Progress Notes (Signed)
A/ox3 pleased with MAC, report to kristin RN 

## 2013-11-10 NOTE — Patient Instructions (Signed)
YOU HAD AN ENDOSCOPIC PROCEDURE TODAY AT [facility]: Refer to the procedure report that was given to you for any specific questions about what was found during the examination.  If the procedure report does not answer your questions, please call your gastroenterologist to clarify.  If you requested that your care partner not be given the details of your procedure findings, then the procedure report has been included in a sealed envelope for you to review at your convenience later.  YOU SHOULD EXPECT: Some feelings of bloating in the abdomen. Passage of more gas than usual.  Walking can help get rid of the air that was put into your GI tract during the procedure and reduce the bloating. If you had a lower endoscopy (such as a colonoscopy or flexible sigmoidoscopy) you may notice spotting of blood in your stool or on the toilet paper. If you underwent a bowel prep for your procedure, then you may not have a normal bowel movement for a few days.  DIET: Your first meal following the procedure should be a light meal and then it is ok to progress to your normal diet.  A half-sandwich or bowl of soup is an example of a good first meal.  Heavy or fried foods are harder to digest and may make you feel nauseous or bloated.  Likewise meals heavy in dairy and vegetables can cause extra gas to form and this can also increase the bloating.  Drink plenty of fluids but you should avoid alcoholic beverages for 24 hours.  ACTIVITY: Your care partner should take you home directly after the procedure.  You should plan to take it easy, moving slowly for the rest of the day.  You can resume normal activity the day after the procedure however you should NOT DRIVE or use heavy machinery for 24 hours (because of the sedation medicines used during the test).    SYMPTOMS TO REPORT IMMEDIATELY: A gastroenterologist can be reached at any hour.  During normal business hours, 8:30 AM to 5:00 PM Monday through Friday,  call [phone].  After hours and on weekends, please call the GI answering service at [phone] who will take a message and have the physician on call contact you.   Following lower endoscopy (colonoscopy or flexible sigmoidoscopy):  Excessive amounts of blood in the stool  Significant tenderness or worsening of abdominal pains  Swelling of the abdomen that is new, acute  Fever of 100F or higher  FOLLOW UP: Our staff will call the home number listed on your records the next business day following your procedure to check on you and address any questions or concerns that you may have at that time regarding the information given to you following your procedure. This is a courtesy call and so if there is no answer at the home number and we have not heard from you through the emergency physician on call, we will assume that you have returned to your regular daily activities without incident.  SIGNATURES/CONFIDENTIALITY: You and/or your care partner have signed paperwork which will be entered into your electronic medical record.  These signatures attest to the fact that that the information above on your After Visit Summary has been reviewed and is understood.  Full responsibility of the confidentiality of this discharge information lies with you and/or your care-partner.  Await pathology  Continue your normal medications  Please read handouts about hemorrhoids and high fiber diets  Next colonoscopy in 10 years

## 2013-11-11 ENCOUNTER — Telehealth: Payer: Self-pay | Admitting: *Deleted

## 2013-11-11 NOTE — Telephone Encounter (Signed)
  Follow up Call-  Call back number 11/10/2013  Post procedure Call Back phone  # [phone]  Permission to leave phone message Yes     Patient questions:  Do you have a fever, pain , or abdominal swelling? no Pain Score  0 *  Have you tolerated food without any problems? yes  Have you been able to return to your normal activities? yes  Do you have any questions about your discharge instructions: Diet   no Medications  no Follow up visit  no  Do you have questions or concerns about your Care? no  Actions: * If pain score is 4 or above: No action needed, pain <4.

## 2014-04-13 ENCOUNTER — Encounter: Payer: Self-pay | Admitting: Gastroenterology

## 2016-01-10 DIAGNOSIS — R972 Elevated prostate specific antigen [PSA]: Secondary | ICD-10-CM | POA: Insufficient documentation

## 2016-01-19 DIAGNOSIS — R972 Elevated prostate specific antigen [PSA]: Secondary | ICD-10-CM | POA: Insufficient documentation

## 2016-12-18 ENCOUNTER — Telehealth: Payer: Self-pay | Admitting: Gastroenterology

## 2016-12-18 NOTE — Telephone Encounter (Signed)
Patient's last colonoscopy was in 2015.  He had no polyps. Would his mother's diagnosis at 14 of colon cancer change his recall?

## 2016-12-18 NOTE — Telephone Encounter (Signed)
Patient notified,  New recall change entered.

## 2016-12-18 NOTE — Telephone Encounter (Signed)
Guidelines say colon cancer in one first degree relative under 60 is 5 years. He could do 10 or 5. Given his age I would recommend 5 years, 10/2018, for the next exam if it is negative consider going to 10 years.

## 2017-09-10 ENCOUNTER — Encounter: Payer: Self-pay | Admitting: Neurology

## 2017-09-18 ENCOUNTER — Encounter (HOSPITAL_COMMUNITY): Payer: Self-pay | Admitting: *Deleted

## 2017-09-18 ENCOUNTER — Emergency Department (HOSPITAL_COMMUNITY)
Admission: EM | Admit: 2017-09-18 | Discharge: 2017-09-18 | Disposition: A | Discharge status: Home / Self Care | Payer: BLUE CROSS/BLUE SHIELD | Attending: Emergency Medicine | Admitting: Emergency Medicine

## 2017-09-18 ENCOUNTER — Emergency Department (HOSPITAL_COMMUNITY): Payer: BLUE CROSS/BLUE SHIELD

## 2017-09-18 DIAGNOSIS — G43009 Migraine without aura, not intractable, without status migrainosus: Secondary | ICD-10-CM | POA: Insufficient documentation

## 2017-09-18 DIAGNOSIS — Z7982 Long term (current) use of aspirin: Secondary | ICD-10-CM | POA: Diagnosis not present

## 2017-09-18 DIAGNOSIS — Z79899 Other long term (current) drug therapy: Secondary | ICD-10-CM | POA: Insufficient documentation

## 2017-09-18 DIAGNOSIS — R51 Headache: Secondary | ICD-10-CM | POA: Diagnosis present

## 2017-09-18 LAB — CBC WITH DIFFERENTIAL/PLATELET
BASOS PCT: 0 %
Basophils Absolute: 0 10*3/uL (ref 0.0–0.1)
EOS PCT: 1 %
Eosinophils Absolute: 0.1 10*3/uL (ref 0.0–0.7)
HCT: 42.2 % (ref 39.0–52.0)
HEMOGLOBIN: 14.4 g/dL (ref 13.0–17.0)
Lymphocytes Relative: 30 %
Lymphs Abs: 1.8 10*3/uL (ref 0.7–4.0)
MCH: 31.2 pg (ref 26.0–34.0)
MCHC: 34.1 g/dL (ref 30.0–36.0)
MCV: 91.5 fL (ref 78.0–100.0)
MONO ABS: 0.4 10*3/uL (ref 0.1–1.0)
MONOS PCT: 6 %
NEUTROS PCT: 63 %
Neutro Abs: 3.7 10*3/uL (ref 1.7–7.7)
PLATELETS: 144 10*3/uL — AB (ref 150–400)
RBC: 4.61 MIL/uL (ref 4.22–5.81)
RDW: 13.6 % (ref 11.5–15.5)
WBC: 6 10*3/uL (ref 4.0–10.5)

## 2017-09-18 LAB — BASIC METABOLIC PANEL
Anion gap: 7 (ref 5–15)
BUN: 16 mg/dL (ref 6–20)
CO2: 27 mmol/L (ref 22–32)
Calcium: 9.4 mg/dL (ref 8.9–10.3)
Chloride: 107 mmol/L (ref 101–111)
Creatinine, Ser: 0.67 mg/dL (ref 0.61–1.24)
GFR calc non Af Amer: >60 mL/min (ref >60)
Glucose, Bld: 102 mg/dL — ABNORMAL HIGH (ref 65–99)
Potassium: 4.3 mmol/L (ref 3.5–5.1)
Sodium: 141 mmol/L (ref 135–145)

## 2017-09-18 MED ORDER — DIPHENHYDRAMINE HCL 50 MG/ML IJ SOLN
12.5000 mg | Freq: Once | INTRAMUSCULAR | Status: AC
  Administered 2017-09-18: 12.5 mg via INTRAVENOUS
  Filled 2017-09-18: qty 1

## 2017-09-18 MED ORDER — PROCHLORPERAZINE EDISYLATE 5 MG/ML IJ SOLN
10.0000 mg | Freq: Once | INTRAMUSCULAR | Status: AC
  Administered 2017-09-18: 10 mg via INTRAVENOUS
  Filled 2017-09-18: qty 2

## 2017-09-18 MED ORDER — LORAZEPAM 2 MG/ML IJ SOLN
0.5000 mg | Freq: Once | INTRAMUSCULAR | Status: AC
  Administered 2017-09-18: 0.5 mg via INTRAVENOUS
  Filled 2017-09-18: qty 1

## 2017-09-18 MED ORDER — BUTALBITAL-APAP-CAFFEINE 50-325-40 MG PO TABS: 1.0000 | ORAL_TABLET | Freq: Four times a day (QID) | ORAL | 0 refills | Status: DC | PRN

## 2017-09-18 MED ORDER — SODIUM CHLORIDE 0.9 % IV BOLUS (SEPSIS)
1000.0000 mL | Freq: Once | INTRAVENOUS | Status: AC
  Administered 2017-09-18: 1000 mL via INTRAVENOUS

## 2017-09-18 MED ORDER — KETOROLAC TROMETHAMINE 30 MG/ML IJ SOLN
30.0000 mg | Freq: Once | INTRAMUSCULAR | Status: AC
  Administered 2017-09-18: 30 mg via INTRAVENOUS
  Filled 2017-09-18: qty 1

## 2017-09-18 NOTE — ED Triage Notes (Signed)
To ED for eval of intermittent HAs over the past few weeks. 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 These headaches come on gradually. Last night he started with this HA. Took otc meds and woke with lingering HA. No vomiting. No neuro deficits. No hx of trauma. Pt saw eye doctor a few weeks ago without dx. Denies weakness.

## 2017-09-18 NOTE — ED Provider Notes (Signed)
[facility] EMERGENCY DEPARTMENT Provider Note   CSN: [id number] Arrival date & time: 09/18/17  1106     History   Chief Complaint Chief Complaint  Patient presents with  . Headache    HPI Eric Parks. is a 62 y.o. male who presents to ED for evaluation of intermittent, left-sided and sometimes generalized, throbbing and aching headaches for the past several months.  States that sometimes even bumping his head or exposure to light can trigger the headaches.  Unsure if this feels migraine-like but states that his sensitivity light does increase when the headaches are present.  Cannot recall any inciting event several months ago that may have triggered the initial pain.  States that the pain sometimes radiates to the back of his head.  He has tried Advil with no improvement in his symptoms and he states that Advil does usually help with his usual headaches.  He was told by his ophthalmologist that he has ocular migraines but states that these usually include loss of vision and floaters in his eyes but she is not experiencing since these headaches began.  Denies any vision changes, vomiting, neck pain, fevers, history of cancer, head injuries, numbness in arms or legs, changes in gait or speech, history of CVA, blood thinner use, history of aneurysms.  HPI  Past Medical History:  Diagnosis Date  . Depression    reactive  . History of chicken pox   . Spinal stenosis     Patient Active Problem List   Diagnosis Date Noted  . Spinal stenosis 08/10/2011  . Depression, reactive 01/26/2011  . Lumbar pain with radiation down left leg 01/26/2011  . OA (osteoarthritis of the spine) 01/26/2011    Past Surgical History:  Procedure Laterality Date  . BACK SURGERY    . HERNIA REPAIR    . TONSILLECTOMY  1977       Home Medications    Prior to Admission medications   Medication Sig Start Date End Date Taking? Authorizing Provider  aspirin 81 MG tablet Take  160 mg by mouth daily.    [provider]  b complex vitamins tablet Take 1 tablet by mouth daily.    [provider]  BIOTIN PO Take by mouth.    [provider]  butalbital-acetaminophen-caffeine (FIORICET, ESGIC) [phone] MG tablet Take 1 tablet by mouth every 6 (six) hours as needed for headache. 09/18/17 09/18/18  Shronda Boeh, Nicanor Alcon, PA-C  celecoxib (CELEBREX) 200 MG capsule Take 200 mg by mouth daily.     [provider]  clorazepate (TRANXENE) 7.5 MG tablet Take 1 tablet (7.5 mg total) by mouth 2 (two) times daily as needed for anxiety. 10/20/12   Ricard Dillon, MD  HYDROcodone-acetaminophen (NORCO/VICODIN) 5-325 MG per tablet Take 1 tablet by mouth every 6 (six) hours as needed for moderate pain.    [provider]  Multiple Vitamin (MULTIVITAMIN) tablet Take 1 tablet by mouth daily.    [provider]  Omega-3 Fatty Acids (FISH OIL PO) Take by mouth.    [provider]  Vitamin D, Cholecalciferol, 1000 UNITS TABS Take by mouth.    [provider]    Family History Family History  Problem Relation Age of Onset  . Arthritis Father   . Hyperlipidemia Father   . Heart disease Father   . Hypertension Father   . Diabetes Father   . Arthritis Mother   . Stomach cancer Maternal Aunt  stomach cancer or ovarian  . Melanoma Maternal Grandfather   . Colon cancer Neg Hx   . Pancreatic cancer Neg Hx   . Rectal cancer Neg Hx     Social History Social History   Tobacco Use  . Smoking status: Never Smoker  Substance Use Topics  . Alcohol use: Yes  . Drug use: Not on file     Allergies   Patient has no known allergies.   Review of Systems Review of Systems  Constitutional: Negative for appetite change, chills and fever.  HENT: Negative for ear pain, rhinorrhea, sneezing and sore throat.   Eyes: Positive for photophobia. Negative for visual disturbance.  Respiratory: Negative for cough, chest tightness,  shortness of breath and wheezing.   Cardiovascular: Negative for chest pain and palpitations.  Gastrointestinal: Negative for abdominal pain, blood in stool, constipation, diarrhea, nausea and vomiting.  Genitourinary: Negative for dysuria, hematuria and urgency.  Musculoskeletal: Negative for myalgias.  Skin: Negative for rash.  Neurological: Positive for dizziness and headaches. Negative for weakness and light-headedness.     Physical Exam Updated Vital Signs BP 124/78   Pulse 88   Temp 98 F (36.7 C) (Oral)   Resp 16   SpO2 99%   Physical Exam  Constitutional: He is oriented to person, place, and time. He appears well-developed and well-nourished. No distress.  Nontoxic appearing and in no acute distress.  HENT:  Head: Normocephalic and atraumatic.  Nose: Nose normal.  Eyes: Conjunctivae and EOM are normal. Pupils are equal, round, and reactive to light. Right eye exhibits no discharge. Left eye exhibits no discharge. No scleral icterus.  No nystagmus noted.  Neck: Normal range of motion. Neck supple.  No neck tenderness to palpation.  Cardiovascular: Normal rate, regular rhythm, normal heart sounds and intact distal pulses. Exam reveals no gallop and no friction rub.  No murmur heard. Pulmonary/Chest: Effort normal and breath sounds normal. No respiratory distress.  Abdominal: Soft. Bowel sounds are normal. He exhibits no distension. There is no tenderness. There is no guarding.  Musculoskeletal: Normal range of motion. He exhibits no edema.  Neurological: He is alert and oriented to person, place, and time. No cranial nerve deficit or sensory deficit. He exhibits normal muscle tone. Coordination normal.  Alert and oriented x4.  Pupils reactive. No facial asymmetry noted. Cranial nerves appear grossly intact. Sensation intact to light touch on face, BUE and BLE. Strength 5/5 in BUE and BLE. Normal finger to nose coordination bilaterally.  Skin: Skin is warm and dry. No rash  noted.  Psychiatric: He has a normal mood and affect.  Nursing note and vitals reviewed.    ED Treatments / Results  Labs (all labs ordered are listed, but only abnormal results are displayed) Labs Reviewed  BASIC METABOLIC PANEL - Abnormal; Notable for the following components:      Result Value   Glucose, Bld 102 (*)    All other components within normal limits  CBC WITH DIFFERENTIAL/PLATELET - Abnormal; Notable for the following components:   Platelets 144 (*)    All other components within normal limits    EKG  EKG Interpretation None       Radiology Ct Head Wo Contrast  Result Date: 09/18/2017 CLINICAL DATA:  Left-sided headaches for several weeks EXAM: CT HEAD WITHOUT CONTRAST TECHNIQUE: Contiguous axial images were obtained from the base of the skull through the vertex without intravenous contrast. COMPARISON:  None. FINDINGS: Brain: No evidence of acute infarction, hemorrhage, hydrocephalus, extra-axial collection  or mass lesion/mass effect. Vascular: No hyperdense vessel or unexpected calcification. Skull: Normal. Negative for fracture or focal lesion. Sinuses/Orbits: No acute finding. Other: None. IMPRESSION: No acute intracranial abnormality noted. Electronically Signed   By: Inez Catalina M.D.   On: 09/18/2017 13:38    Procedures Procedures (including critical care time)  Medications Ordered in ED Medications  sodium chloride 0.9 % bolus 1,000 mL (0 mLs Intravenous Stopped 09/18/17 1522)  prochlorperazine (COMPAZINE) injection 10 mg (10 mg Intravenous Given 09/18/17 1419)  diphenhydrAMINE (BENADRYL) injection 12.5 mg (12.5 mg Intravenous Given 09/18/17 1419)  ketorolac (TORADOL) 30 MG/ML injection 30 mg (30 mg Intravenous Given 09/18/17 1419)  LORazepam (ATIVAN) injection 0.5 mg (0.5 mg Intravenous Given 09/18/17 1526)     Initial Impression / Assessment and Plan / ED Course  I have reviewed the triage vital signs and the nursing notes.  Pertinent labs & imaging  results that were available during my care of the patient were reviewed by me and considered in my medical decision making (see chart for details).     Patient presents to ED for evaluation of intermittent, left-sided and at times generalized, throbbing and aching headaches for the past several months.  States that there is increased and sensitive to light when the headaches occur.  He has tried Advil with no improvement in symptoms which usually helps him with his prior headaches.  Denies any vision changes, vomiting, neck pain, fevers, history of cancer, history of CVA, history of aneurysms.  On physical exam he is overall well-appearing.  He has no deficits on his neurological examination and no neck tenderness to palpation.  He is afebrile with no recent use of antipyretics.  Remainder of vital signs are also stable.  He has no temporal tenderness to palpation.  CT of the head returned as negative for acute abnormality.  CBC, BMP unremarkable.  Patient given fluids, Compazine, Benadryl and Toradol with improvement in his symptoms.  He did become slightly agitated after the medications were administered but this improved with IV Ativan. There are no headache characteristics that are lateralizing or concerning for increased ICP, infectious or vascular cause of his symptoms.    He states that his headache is feeling much better.  I did want to refer him to neurology for further evaluation and any further imaging in the future.  At this time I do not suspect infectious, vascular or malignant cause of his symptoms.  Will give prescription for Fioricet and advised strict return precautions.  Portions of this note were generated with [employer]. Dictation errors may occur despite best attempts at proofreading.   Final Clinical Impressions(s) / ED Diagnoses   Final diagnoses:  Migraine without aura and without status migrainosus, not intractable    ED Discharge Orders        Ordered     Ambulatory referral to Neurology    Comments:  An appointment is requested in approximately: 1 week   09/18/17 1422    butalbital-acetaminophen-caffeine (FIORICET, ESGIC) 50-325-40 MG tablet  Every 6 hours PRN     09/18/17 1546       Delia Heady, PA-C 09/18/17 1551    Isla Pence, MD 09/18/17 1605

## 2017-09-18 NOTE — Discharge Instructions (Addendum)
The neurologist's office will contact you for an appointment. Return to ED for any worsening symptoms, severe or sudden headache, blurry vision, neck pain with fevers, injuries or falls, trouble walking or numbness in arms or legs.

## 2017-09-18 NOTE — ED Notes (Signed)
Declined W/C at D/C and was escorted to lobby by RN. 

## 2017-09-30 ENCOUNTER — Encounter: Payer: Self-pay | Admitting: Neurology

## 2017-09-30 ENCOUNTER — Ambulatory Visit (INDEPENDENT_AMBULATORY_CARE_PROVIDER_SITE_OTHER): Payer: BLUE CROSS/BLUE SHIELD | Admitting: Neurology

## 2017-09-30 VITALS — BP 108/72 | HR 71 | Resp 16 | Ht 68.0 in | Wt 167.2 lb

## 2017-09-30 DIAGNOSIS — G43109 Migraine with aura, not intractable, without status migrainosus: Secondary | ICD-10-CM | POA: Diagnosis not present

## 2017-09-30 DIAGNOSIS — G43009 Migraine without aura, not intractable, without status migrainosus: Secondary | ICD-10-CM | POA: Diagnosis not present

## 2017-09-30 DIAGNOSIS — G44229 Chronic tension-type headache, not intractable: Secondary | ICD-10-CM | POA: Diagnosis not present

## 2017-09-30 NOTE — Patient Instructions (Signed)
Migraine Recommendations: 1.  Continue with physical therapy and dry needling.  Follow up in 3 months and if headaches still persist, we can discuss other options.  If doing well, then please cancel the appointment. 2.  Limit use of pain relievers to no more than 2 days out of the week.  These medications include acetaminophen, ibuprofen, triptans and narcotics.  This will help reduce risk of rebound headaches.  DO NOT TAKE FIORICET 3.  Be aware of common food triggers such as processed sweets, processed foods with nitrites (such as deli meat, hot dogs, sausages), foods with MSG, alcohol (such as wine), chocolate, certain cheeses, certain fruits (dried fruits, bananas, some citrus fruit), vinegar, diet soda. 4.  Avoid caffeine 5.  Routine exercise 6.  Proper sleep hygiene 7.  Stay adequately hydrated with water 8.  Keep a headache diary. 9.  Maintain proper stress management. 10.  Do not skip meals. 11.  Consider supplements:  Magnesium citrate 400mg  to 600mg  daily, riboflavin 400mg , Coenzyme Q 10 100mg  three times daily

## 2017-09-30 NOTE — Progress Notes (Signed)
NEUROLOGY CONSULTATION NOTE  Eric Parks. MRN: [id number] DOB: 1955/12/04  Referring provider: Dr. Einar Gip Primary care provider: Dr. Melford Aase  Reason for consult:  headache  HISTORY OF PRESENT ILLNESS: Eric Parks is a 62 year old male with depression and spinal stenosis of the lumbar spine who presents for headache.  History supplemented by ED note.  For the past 6 to 8 months, he has had frequent headaches which begin in the left trapezius and left side of neck and radiates up the back of neck to the left side of his head.  It is a moderate non-throbbing headache, lasting 5-6 hours and occurring almost daily.  Triggers include bright light or loud sounds.  Nothing specifically relieves it.  There is no associated numbness, unilateral weakness, nausea, vomiting, or visual disturbance.  He usually takes Advil, which helps.  He recently started dry needling which has helped.  CT head from 09/18/17 was personally reviewed and was unremarkable.  For several years, he has had severe left-sided pounding headache that is triggered by light, loud noise, and alcohol.  There was associated photophobia and phonophobia.  He stopped drinking alcohol and they have pretty much resolved.  Since his early 87s, he has had ocular migraines.  He develops central vision loss with no associated headache.  It typically lasts 60 to 90 minutes and resolves.  It occurs 2 to 3 times a year.  He has had one or two severe episodes where he has word-finding difficulty or problems recalling names.  Current NSAIDS:  Advil, ASA Current analgesics:  no Current triptans:  no Current anti-emetic:  no Current muscle relaxants:  no Current anti-anxiolytic:  Tranxene Current sleep aide:  no Current Antihypertensive medications:  no Current Antidepressant medications:  no Current Anticonvulsant medications:  no Current Vitamins/Herbal/Supplements:  no Current Antihistamines/Decongestants:  no Other  therapy:  Physical therapy of neck, dry needling  Past NSAIDS:  no Past analgesics:  no Past abortive triptans:  no Past muscle relaxants:  no Past anti-emetic:  no Past antihypertensive medications:  no Past antidepressant medications:  no Past anticonvulsant medications:  no Past vitamins/Herbal/Supplements:  no Past antihistamines/decongestants:  no Other past therapies:  no  Caffeine:  Black tea Alcohol:  no Smoker:  no Diet:  Does not hydrate enough Exercise:  walks Depression:  A little; Anxiety:  A little Other pain:  Back pain Sleep hygiene:  good Family history of headache:  Mother, sister, brother, niece  PAST MEDICAL HISTORY: Past Medical History:  Diagnosis Date  . Depression    reactive  . History of chicken pox   . Spinal stenosis     PAST SURGICAL HISTORY: Past Surgical History:  Procedure Laterality Date  . BACK SURGERY    . HERNIA REPAIR    . TONSILLECTOMY  1977    MEDICATIONS: Current Outpatient Medications on File Prior to Visit  Medication Sig Dispense Refill  . aspirin 81 MG tablet Take 160 mg by mouth daily.    Marland Kitchen BIOTIN PO Take by mouth.    . clorazepate (TRANXENE) 7.5 MG tablet Take 1 tablet (7.5 mg total) by mouth 2 (two) times daily as needed for anxiety. 180 tablet 0  . Multiple Vitamin (MULTIVITAMIN) tablet Take 1 tablet by mouth daily.    Marland Kitchen olmesartan (BENICAR) 20 MG tablet     . Omega-3 Fatty Acids (FISH OIL PO) Take by mouth.    . rosuvastatin (CRESTOR) 10 MG tablet     . Vitamin  D, Cholecalciferol, 1000 UNITS TABS Take by mouth.    . butalbital-acetaminophen-caffeine (FIORICET, ESGIC) 50-325-40 MG tablet Take 1 tablet by mouth every 6 (six) hours as needed for headache. (Patient not taking: Reported on 09/30/2017) 10 tablet 0   No current facility-administered medications on file prior to visit.     ALLERGIES: No Known Allergies  FAMILY HISTORY: Family History  Problem Relation Age of Onset  . Arthritis Father   .  Hyperlipidemia Father   . Heart disease Father   . Hypertension Father   . Diabetes Father   . Arthritis Mother   . Stomach cancer Maternal Aunt        stomach cancer or ovarian  . Melanoma Maternal Grandfather   . Colon cancer Neg Hx   . Pancreatic cancer Neg Hx   . Rectal cancer Neg Hx     SOCIAL HISTORY: Social History   Socioeconomic History  . Marital status: Single    Spouse name: Not on file  . Number of children: Not on file  . Years of education: Not on file  . Highest education level: Not on file  Occupational History  . Occupation: Probation officer  Social Needs  . Financial resource strain: Not on file  . Food insecurity:    Worry: Not on file    Inability: Not on file  . Transportation needs:    Medical: Not on file    Non-medical: Not on file  Tobacco Use  . Smoking status: Never Smoker  . Smokeless tobacco: Never Used  Substance and Sexual Activity  . Alcohol use: Yes  . Drug use: Not on file  . Sexual activity: Yes  Lifestyle  . Physical activity:    Days per week: Not on file    Minutes per session: Not on file  . Stress: Not on file  Relationships  . Social connections:    Talks on phone: Not on file    Gets together: Not on file    Attends religious service: Not on file    Active member of club or organization: Not on file    Attends meetings of clubs or organizations: Not on file    Relationship status: Not on file  . Intimate partner violence:    Fear of current or ex partner: Not on file    Emotionally abused: Not on file    Physically abused: Not on file    Forced sexual activity: Not on file  Other Topics Concern  . Not on file  Social History Narrative  . Not on file    REVIEW OF SYSTEMS: Constitutional: No fevers, chills, or sweats, no generalized fatigue, change in appetite Eyes: No visual changes, double vision, eye pain Ear, nose and throat: No hearing loss, ear pain, nasal congestion, sore throat Cardiovascular: No chest pain,  palpitations Respiratory:  No shortness of breath at rest or with exertion, wheezes GastrointestinaI: No nausea, vomiting, diarrhea, abdominal pain, fecal incontinence Genitourinary:  No dysuria, urinary retention or frequency Musculoskeletal:  No neck pain, back pain Integumentary: No rash, pruritus, skin lesions Neurological: as above Psychiatric: No depression, insomnia, anxiety Endocrine: No palpitations, fatigue, diaphoresis, mood swings, change in appetite, change in weight, increased thirst Hematologic/Lymphatic:  No purpura, petechiae. Allergic/Immunologic: no itchy/runny eyes, nasal congestion, recent allergic reactions, rashes  PHYSICAL EXAM: Vitals:   09/30/17 1302  BP: 108/72  Pulse: 71  Resp: 16  SpO2: 98%   General: No acute distress.  Patient appears well-groomed.  Head:  Normocephalic/atraumatic Eyes:  fundi examined but not visualized Neck: supple, no paraspinal tenderness, full range of motion Back: No paraspinal tenderness Heart: regular rate and rhythm Lungs: Clear to auscultation bilaterally. Vascular: No carotid bruits. Neurological Exam: Mental status: alert and oriented to person, place, and time, recent and remote memory intact, fund of knowledge intact, attention and concentration intact, speech fluent and not dysarthric, language intact. Cranial nerves: CN I: not tested CN II: pupils equal, round and reactive to light, visual fields intact CN III, IV, VI:  full range of motion, no nystagmus, no ptosis CN V: facial sensation intact CN VII: upper and lower face symmetric CN VIII: hearing intact CN IX, X: gag intact, uvula midline CN XI: sternocleidomastoid and trapezius muscles intact CN XII: tongue midline Bulk & Tone: normal, no fasciculations. Motor:  5/5 throughout  Sensation: temperature and vibration sensation intact. Deep Tendon Reflexes:  2+ throughout, toes downgoing.  Finger to nose testing:  Without dysmetria.  Heel to shin:  Without  dysmetria.  Gait:  Normal station and stride.  Able to turn and tandem walk. Romberg negative.  IMPRESSION: 1.  Chronic tension-type headache, cervicalgia 2.  Migraine without aura, stable 3.  Ocular migraine, stable  PLAN: 1.  He would like to continue dry needling for now.  If headaches not improved, he will follow up and will then be open to possible medications such as nortriptyline.   2.  Advised to limit use of pain relievers to no more than 2 days out of week to prevent rebound headache 3.  Headache diary 4.  Caffeine cessation, hydration, continue exercise 5.  Follow up in 3 months.  If doing well, he was advised to cancel appointment.  Thank you for allowing me to take part in the care of this patient.  Metta Clines, DO  CC:  Anastasia Pall, MD  Adrian Prows, MD

## 2017-10-02 ENCOUNTER — Encounter: Payer: Self-pay | Admitting: Neurology

## 2017-10-15 ENCOUNTER — Ambulatory Visit: Payer: Self-pay | Admitting: Neurology

## 2017-10-24 ENCOUNTER — Encounter: Payer: Self-pay | Admitting: Neurology

## 2018-01-10 ENCOUNTER — Ambulatory Visit: Payer: BLUE CROSS/BLUE SHIELD | Admitting: Neurology

## 2018-07-08 ENCOUNTER — Telehealth: Payer: Self-pay | Admitting: Gastroenterology

## 2018-07-08 NOTE — Telephone Encounter (Signed)
Patient is due for recall colon this year 05.2020 but wants to know if he can get it done sooner since his mom recently passed away of colon cancer. Please advise.

## 2018-07-08 NOTE — Telephone Encounter (Signed)
OK to schedule colonoscopy now.

## 2018-07-08 NOTE — Telephone Encounter (Signed)
Are you okay with patient scheduling now?  He is due for recall in May?

## 2018-07-30 NOTE — Telephone Encounter (Signed)
Left message on mobile voicemail for patient to call back and schedule from recall.

## 2018-08-04 ENCOUNTER — Encounter: Payer: Self-pay | Admitting: Gastroenterology

## 2018-08-19 ENCOUNTER — Ambulatory Visit (AMBULATORY_SURGERY_CENTER): Payer: Self-pay | Admitting: *Deleted

## 2018-08-19 VITALS — Ht 68.0 in | Wt 170.0 lb

## 2018-08-19 DIAGNOSIS — Z800 Family history of malignant neoplasm of digestive organs: Secondary | ICD-10-CM

## 2018-08-19 MED ORDER — NA SULFATE-K SULFATE-MG SULF 17.5-3.13-1.6 GM/177ML PO SOLN: ORAL | 0 refills | Status: DC

## 2018-08-19 NOTE — Progress Notes (Signed)
Patient denies any allergies to eggs or soy. Patient denies any problems with anesthesia/sedation. Patient denies any oxygen use at home. Patient denies taking any diet/weight loss medications or blood thinners. Suprep $15 off coupon given to pt.

## 2018-08-22 ENCOUNTER — Encounter: Payer: Self-pay | Admitting: Gastroenterology

## 2018-08-27 ENCOUNTER — Other Ambulatory Visit: Payer: Self-pay

## 2018-08-27 MED ORDER — OLMESARTAN MEDOXOMIL 20 MG PO TABS: 20.0000 mg | ORAL_TABLET | Freq: Every day | ORAL | 1 refills | Status: DC

## 2018-09-01 ENCOUNTER — Encounter: Payer: BLUE CROSS/BLUE SHIELD | Admitting: Gastroenterology

## 2018-09-05 ENCOUNTER — Ambulatory Visit (AMBULATORY_SURGERY_CENTER): Payer: BLUE CROSS/BLUE SHIELD | Admitting: Gastroenterology

## 2018-09-05 ENCOUNTER — Other Ambulatory Visit: Payer: Self-pay

## 2018-09-05 ENCOUNTER — Telehealth: Payer: Self-pay | Admitting: Gastroenterology

## 2018-09-05 ENCOUNTER — Encounter: Payer: Self-pay | Admitting: Gastroenterology

## 2018-09-05 VITALS — BP 137/74 | HR 74 | Temp 96.8°F | Resp 10 | Ht 68.0 in | Wt 170.0 lb

## 2018-09-05 DIAGNOSIS — Z1211 Encounter for screening for malignant neoplasm of colon: Secondary | ICD-10-CM | POA: Diagnosis not present

## 2018-09-05 DIAGNOSIS — Z800 Family history of malignant neoplasm of digestive organs: Secondary | ICD-10-CM

## 2018-09-05 DIAGNOSIS — D128 Benign neoplasm of rectum: Secondary | ICD-10-CM | POA: Diagnosis not present

## 2018-09-05 DIAGNOSIS — K6282 Dysplasia of anus: Secondary | ICD-10-CM

## 2018-09-05 DIAGNOSIS — K640 First degree hemorrhoids: Secondary | ICD-10-CM | POA: Diagnosis not present

## 2018-09-05 MED ORDER — TRAMADOL HCL 50 MG PO TABS: 50.0000 mg | ORAL_TABLET | Freq: Four times a day (QID) | ORAL | 0 refills | Status: DC

## 2018-09-05 MED ORDER — SODIUM CHLORIDE 0.9 % IV SOLN: 500.0000 mL | Freq: Once | INTRAVENOUS | Status: DC

## 2018-09-05 NOTE — Op Note (Addendum)
[facility] Patient Name: Eric Parks Procedure Date: 09/05/2018 1:56 PM MRN: [id number] Endoscopist: Ladene Artist , MD Age: 63 Referring MD:  Date of Birth: 01-09-1956 Gender: Male Account #: [id number] Procedure:                Colonoscopy Indications:              Screening in patient at increased risk: Family                            history of 1st-degree relative with colorectal                            cancer Medicines:                Monitored Anesthesia Care Procedure:                Pre-Anesthesia Assessment:                           - Prior to the procedure, a History and Physical                            was performed, and patient medications and                            allergies were reviewed. The patient's tolerance of                            previous anesthesia was also reviewed. The risks                            and benefits of the procedure and the sedation                            options and risks were discussed with the patient.                            All questions were answered, and informed consent                            was obtained. Prior Anticoagulants: The patient has                            taken no previous anticoagulant or antiplatelet                            agents. ASA Grade Assessment: II - A patient with                            mild systemic disease. After reviewing the risks                            and benefits, the patient was deemed in  satisfactory condition to undergo the procedure.                           After obtaining informed consent, the colonoscope                            was passed under direct vision. Throughout the                            procedure, the patient's blood pressure, pulse, and                            oxygen saturations were monitored continuously. The                            Colonoscope was introduced through the anus and                      advanced to the the cecum, identified by                            appendiceal orifice and ileocecal valve. The                            ileocecal valve, appendiceal orifice, and rectum                            were photographed. The quality of the bowel                            preparation was good. The colonoscopy was performed                            without difficulty. The patient tolerated the                            procedure well. Scope In: 2:06:16 PM Scope Out: 2:30:44 PM Scope Withdrawal Time: 0 hours 21 minutes 22 seconds  Total Procedure Duration: 0 hours 24 minutes 28 seconds  Findings:                 The perianal and digital rectal examinations were                            normal.                           A 5 mm polyp was found in the distal rectum located                            at the edge of an internal hemorrhoid. The polyp                            was sessile. The polyp was removed with a cold  biopsy forceps. Resection and retrieval were                            complete.                           Internal hemorrhoids were found during                            retroflexion. The hemorrhoids were moderate and                            Grade I (internal hemorrhoids that do not                            prolapse). For bleeding encountered after polyp                            biopsy the one bleeding hemorrhoid was injected                            with 1 mL of hyertonic saline with bleeding                            reduced, however it persisted. 1 mL of a 1:10,000                            solution of epinephrine was injected into the                            hemorrhoid for hemostasis with further reduction in                            bleeding however mild oozing persisted. Estimated                            blood loss was minimal.                           The exam was otherwise without  abnormality on                            direct and retroflexion views. Complications:            No immediate complications. Estimated blood loss:                            None. Estimated Blood Loss:     Estimated blood loss: none. Impression:               - One 5 mm polyp in the rectum, removed with a cold                            biopsy forceps. Resected and retrieved.                           -  Internal hemorrhoids. Mild bleeding following                            polyp biopsy. Injected as described.                           - The examination was otherwise normal on direct                            and retroflexion views. Recommendation:           - Repeat colonoscopy in 5 years for screening                            purposes.                           - Patient has a contact number available for                            emergencies. The signs and symptoms of potential                            delayed complications were discussed with the                            patient. Return to normal activities tomorrow.                            Written discharge instructions were provided to the                            patient.                           - Resume previous diet.                           - Continue present medications.                           - Cold pack to rectum for 15 minuites each hour for                            next few hours.                           - Bedrest for next several hours until bleeding                            stops.                           - Await pathology results.                           - Tramadol 50 mg 1 - 2 po qid prn pain, #  30, no                            refills.                           - No aspirin, ibuprofen, naproxen, or other                            non-steroidal anti-inflammatory drugs for 1 week                            after biopsy. Ladene Artist, MD 09/05/2018 2:42:47 PM This report has been  signed electronically.

## 2018-09-05 NOTE — Progress Notes (Signed)
To PACU, VSS. Report to [email or web address] 

## 2018-09-05 NOTE — Progress Notes (Signed)
Pt. Reports no change in his medical or surgical history since his pre-visit 08/19/2018.

## 2018-09-05 NOTE — Telephone Encounter (Signed)
Left a message for patient letting him know we resent his prescription to [facility] and to call us with any issues.

## 2018-09-05 NOTE — Telephone Encounter (Signed)
Pt called stating that his pharmacy has not gotten prescription for tramadol yet. He is in pain and would like for [place] to check with his pharmacy, pls.

## 2018-09-05 NOTE — Patient Instructions (Addendum)
Information on polyps and hemorrhoids given to you today.  Await pathology results.  No aspirin, ibuprofen, naproxen or any non-steroidal anti-inflammatory medications for 1 week after biopsy.  Tramadol 1-2 pills by mouth four times a day prn pain.  Prescribe #30 with no refills.  Cold pack to rectum for 15 minutes each hour for the next few hours.  YOU HAD AN ENDOSCOPIC PROCEDURE TODAY AT [facility]:   Refer to the procedure report that was given to you for any specific questions about what was found during the examination.  If the procedure report does not answer your questions, please call your gastroenterologist to clarify.  If you requested that your care partner not be given the details of your procedure findings, then the procedure report has been included in a sealed envelope for you to review at your convenience later.  YOU SHOULD EXPECT: Some feelings of bloating in the abdomen. Passage of more gas than usual.  Walking can help get rid of the air that was put into your GI tract during the procedure and reduce the bloating. If you had a lower endoscopy (such as a colonoscopy or flexible sigmoidoscopy) you may notice spotting of blood in your stool or on the toilet paper. If you underwent a bowel prep for your procedure, you may not have a normal bowel movement for a few days.  Please Note:  You might notice some irritation and congestion in your nose or some drainage.  This is from the oxygen used during your procedure.  There is no need for concern and it should clear up in a day or so.  SYMPTOMS TO REPORT IMMEDIATELY:   Following lower endoscopy (colonoscopy or flexible sigmoidoscopy):  Excessive amounts of blood in the stool  Significant tenderness or worsening of abdominal pains  Swelling of the abdomen that is new, acute  Fever of 100F or higher    For urgent or emergent issues, a gastroenterologist can be reached at any hour by calling [phone]  [phone].   DIET:  We do recommend a small meal at first, but then you may proceed to your regular diet.  Drink plenty of fluids but you should avoid alcoholic beverages for 24 hours.  ACTIVITY:  You should plan to take it easy for the rest of today and you should NOT DRIVE or use heavy machinery until tomorrow (because of the sedation medicines used during the test).    FOLLOW UP: Our staff will call the number listed on your records the next business day following your procedure to check on you and address any questions or concerns that you may have regarding the information given to you following your procedure. If we do not reach you, we will leave a message.  However, if you are feeling well and you are not experiencing any problems, there is no need to return our call.  We will assume that you have returned to your regular daily activities without incident.  If any biopsies were taken you will be contacted by phone or by letter within the next 1-3 weeks.  Please call us at [phone] if you have not heard about the biopsies in 3 weeks.    SIGNATURES/CONFIDENTIALITY: You and/or your care partner have signed paperwork which will be entered into your electronic medical record.  These signatures attest to the fact that that the information above on your After Visit Summary has been reviewed and is understood.  Full responsibility of the confidentiality of this discharge  information lies with you and/or your care-partner. 

## 2018-09-08 ENCOUNTER — Telehealth: Payer: Self-pay | Admitting: *Deleted

## 2018-09-08 NOTE — Telephone Encounter (Signed)
  Follow up Call-  Call back number 09/05/2018  Post procedure Call Back phone  # [phone]  Permission to leave phone message Yes  Some recent data might be hidden     Patient questions:  Do you have a fever, pain , or abdominal swelling? Still having discomfort in rectum, bleeding has resolved.  Pain Score  2 *  Have you tolerated food without any problems? Yes.    Have you been able to return to your normal activities? Yes.    Do you have any questions about your discharge instructions: Diet   No. Medications  No. Follow up visit  No.  Do you have questions or concerns about your Care? No.  Actions: * If pain score is 4 or above: No action needed, pain <4.

## 2018-09-17 ENCOUNTER — Telehealth: Payer: Self-pay | Admitting: Gastroenterology

## 2018-09-17 NOTE — Telephone Encounter (Signed)
Hi Dr. Fuller Plan, this pt called stating that he was returning your call. He is getting very anxious about his path results and is requesting a call back today. Thank you

## 2018-09-17 NOTE — Telephone Encounter (Signed)
Returned his call See pathology result note

## 2018-09-18 ENCOUNTER — Other Ambulatory Visit: Payer: Self-pay

## 2018-09-18 DIAGNOSIS — K621 Rectal polyp: Secondary | ICD-10-CM

## 2018-09-30 ENCOUNTER — Telehealth: Payer: Self-pay | Admitting: Gastroenterology

## 2018-09-30 NOTE — Telephone Encounter (Signed)
Patient called said that he was suppose to contact us if he did not hear anything from [place], [place] office. We referred him over.

## 2018-09-30 NOTE — Telephone Encounter (Signed)
[facility] not scheduling surgeries at this point due to Covid-19.  Discussed with Dr. Fuller Plan.  Okay to hold the referral until the end of April or Covid crisis.  I explained the situation to patient and he is in agreement.  I will hold referral and resend the end of April

## 2018-12-23 ENCOUNTER — Ambulatory Visit: Payer: Self-pay | Admitting: General Surgery

## 2018-12-23 NOTE — H&P (Signed)
History of Present Illness Eric Ruff MD; 1/61/0960 11:17 AM) The patient is a 63 year old male who presents with anal lesions. 63 year old male who underwent a colonoscopy in March 2020. There was an anal polyp noted on one of his hemorrhoids. This was biopsied and showed AIN grade 2.   Past Surgical History (Tanisha A. Owens Shark, [place]; 12/23/2018 10:35 AM) Colon Polyp Removal - Colonoscopy Open Inguinal Hernia Surgery Right. Spinal Surgery - Lower Back Tonsillectomy  Diagnostic Studies History (Tanisha A. Owens Shark, [place]; 12/23/2018 10:35 AM) Colonoscopy within last year  Allergies (Tanisha A. Owens Shark, [place]; 12/23/2018 10:37 AM) No Known Drug Allergies [12/23/2018]: Allergies Reconciled  Medication History (Tanisha A. Owens Shark, [place]; 12/23/2018 10:37 AM) Olmesartan Medoxomil (20MG  Tablet, Oral) Active. Rosuvastatin Calcium (10MG  Tablet, Oral) Active. Medications Reconciled  Social History (Tanisha A. Owens Shark, [place]; 12/23/2018 10:35 AM) Alcohol use Moderate alcohol use. Caffeine use Tea. No drug use Tobacco use Never smoker.  Family History (Tanisha A. Owens Shark, [place]; 12/23/2018 10:35 AM) Arthritis Mother. Colon Cancer Mother. Depression Father. Diabetes Mellitus Father. Heart Disease Father. Hypertension Father, Mother. Melanoma Family Members In General. Migraine Headache Brother, Mother.  Other Problems (Tanisha A. Owens Shark, [place]; 12/23/2018 10:35 AM) Anxiety Disorder Arthritis Back Pain Enlarged Prostate Hemorrhoids Inguinal Hernia Other disease, cancer, significant illness     Review of Systems (Tanisha A. Brown RMA; 12/23/2018 10:35 AM) General Not Present- Appetite Loss, Chills, Fatigue, Fever, Night Sweats, Weight Gain and Weight Loss. Skin Not Present- Change in Wart/Mole, Dryness, Hives, Jaundice, New Lesions, Non-Healing Wounds, Rash and Ulcer. HEENT Not Present- Earache, Hearing Loss, Hoarseness, Nose Bleed, Oral Ulcers, Ringing in the Ears,  Seasonal Allergies, Sinus Pain, Sore Throat, Visual Disturbances, Wears glasses/contact lenses and Yellow Eyes. Respiratory Not Present- Bloody sputum, Chronic Cough, Difficulty Breathing, Snoring and Wheezing. Breast Not Present- Breast Mass, Breast Pain, Nipple Discharge and Skin Changes. Cardiovascular Not Present- Chest Pain, Difficulty Breathing Lying Down, Leg Cramps, Palpitations, Rapid Heart Rate, Shortness of Breath and Swelling of Extremities. Gastrointestinal Not Present- Abdominal Pain, Bloating, Bloody Stool, Change in Bowel Habits, Chronic diarrhea, Constipation, Difficulty Swallowing, Excessive gas, Gets full quickly at meals, Hemorrhoids, Indigestion, Nausea, Rectal Pain and Vomiting. Male Genitourinary Present- Change in Urinary Stream. Not Present- Blood in Urine, Frequency, Impotence, Nocturia, Painful Urination, Urgency and Urine Leakage. Musculoskeletal Present- Back Pain and Joint Stiffness. Not Present- Joint Pain, Muscle Pain, Muscle Weakness and Swelling of Extremities. Neurological Not Present- Decreased Memory, Fainting, Headaches, Numbness, Seizures, Tingling, Tremor, Trouble walking and Weakness. Psychiatric Present- Anxiety. Not Present- Bipolar, Change in Sleep Pattern, Depression, Fearful and Frequent crying. Endocrine Not Present- Cold Intolerance, Excessive Hunger, Hair Changes, Heat Intolerance, Hot flashes and New Diabetes. Hematology Not Present- Blood Thinners, Easy Bruising, Excessive bleeding, Gland problems, HIV and Persistent Infections.  Vitals (Tanisha A. Brown RMA; 12/23/2018 10:36 AM) 12/23/2018 10:36 AM Weight: 170.2 lb Height: 68in Body Surface Area: 1.91 m Body Mass Index: 25.88 kg/m  Temp.: 98.20F  Pulse: 104 (Regular)  BP: 136/84 (Sitting, Left Arm, Standard)        Physical Exam Eric Ruff MD; 4/54/0981 11:17 AM)  General Mental Status-Alert. General Appearance-Not in acute distress. Build & Nutrition-Well  nourished. Posture-Normal posture. Gait-Normal.  Head and Neck Head-normocephalic, atraumatic with no lesions or palpable masses. Trachea-midline.  Chest and Lung Exam Chest and lung exam reveals -on auscultation, normal breath sounds, no adventitious sounds and normal vocal resonance.  Cardiovascular Cardiovascular examination reveals -normal heart sounds, regular rate and rhythm with no murmurs and no digital  clubbing, cyanosis, edema, increased warmth or tenderness.  Abdomen Inspection Inspection of the abdomen reveals - No Hernias. Palpation/Percussion Palpation and Percussion of the abdomen reveal - Soft, Non Tender, No Rigidity (guarding), No hepatosplenomegaly and No Palpable abdominal masses.  Neurologic Neurologic evaluation reveals -alert and oriented x 3 with no impairment of recent or remote memory, normal attention span and ability to concentrate, normal sensation and normal coordination.  Musculoskeletal Normal Exam - Bilateral-Upper Extremity Strength Normal and Lower Extremity Strength Normal.    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 Pathology revealed AIN grade 2. It was felt that the polyp was incompletely resected. He is here today for further evaluation. We had a long discussion about AIN and risk of turning into cancer. We discussed serial exams here in the office versus high-resolution anoscopy and anal mapping in the operating room. The patient would like to undergo the operating room procedure. We have discussed this in detail including risk of postoperative pain, bleeding and recurrence.  Current Plans

## 2019-01-22 DIAGNOSIS — E785 Hyperlipidemia, unspecified: Secondary | ICD-10-CM | POA: Insufficient documentation

## 2019-01-22 DIAGNOSIS — F5101 Primary insomnia: Secondary | ICD-10-CM | POA: Insufficient documentation

## 2019-01-22 DIAGNOSIS — I10 Essential (primary) hypertension: Secondary | ICD-10-CM | POA: Insufficient documentation

## 2019-01-23 DIAGNOSIS — K6282 Dysplasia of anus: Secondary | ICD-10-CM | POA: Insufficient documentation

## 2019-01-27 ENCOUNTER — Telehealth: Payer: Self-pay

## 2019-01-27 NOTE — Telephone Encounter (Signed)
The patient called and said that his GP thinks his Statin medication is causing his body aches, His GP told him to stop Crestor for a few weeks and see if that is the cause. I told him I would send you a message and call him back next week, when you are back from vacation.

## 2019-01-27 NOTE — Telephone Encounter (Signed)
S/w pt advised will discuss at upcoming appt.

## 2019-01-27 NOTE — Telephone Encounter (Signed)
I agree to stop the medication for now. He will be seeing me soon I think and will decide if we need to do anything different

## 2019-02-02 ENCOUNTER — Other Ambulatory Visit: Payer: Self-pay | Admitting: Cardiology

## 2019-02-09 ENCOUNTER — Encounter: Payer: Self-pay | Admitting: Cardiology

## 2019-02-09 ENCOUNTER — Telehealth: Payer: BC Managed Care – PPO | Admitting: Cardiology

## 2019-02-09 ENCOUNTER — Other Ambulatory Visit: Payer: Self-pay

## 2019-02-09 ENCOUNTER — Other Ambulatory Visit (HOSPITAL_COMMUNITY): Payer: BLUE CROSS/BLUE SHIELD

## 2019-02-09 VITALS — BP 110/69 | HR 79 | Ht 68.0 in | Wt 161.0 lb

## 2019-02-09 DIAGNOSIS — I10 Essential (primary) hypertension: Secondary | ICD-10-CM | POA: Diagnosis not present

## 2019-02-09 DIAGNOSIS — M47814 Spondylosis without myelopathy or radiculopathy, thoracic region: Secondary | ICD-10-CM

## 2019-02-09 DIAGNOSIS — R9439 Abnormal result of other cardiovascular function study: Secondary | ICD-10-CM

## 2019-02-09 DIAGNOSIS — E7800 Pure hypercholesterolemia, unspecified: Secondary | ICD-10-CM

## 2019-02-09 DIAGNOSIS — M791 Myalgia, unspecified site: Secondary | ICD-10-CM

## 2019-02-09 NOTE — Progress Notes (Signed)
Virtual Visit via Video Note: This visit type was conducted due to national recommendations for restrictions regarding the COVID-19 Pandemic (e.g. social distancing).  This format is felt to be most appropriate for this patient at this time.  All issues noted in this document were discussed and addressed.  No physical exam was performed (except for noted visual exam findings with Telehealth visits).  The patient has consented to conduct a Telehealth visit and understands insurance will be billed.   I connected with@, on 02/09/19 at  by a video enabled telemedicine application and verified that I am speaking with the correct person using two identifiers.   I discussed the limitations of evaluation and management by telemedicine and the availability of in person appointments. The patient expressed understanding and agreed to proceed.   I have discussed with patient regarding the safety during COVID Pandemic and steps and precautions to be taken including social distancing, frequent hand wash and use of detergent soap, gels with the patient. I asked the patient to avoid touching mouth, nose, eyes, ears with the hands. I encouraged regular walking around the neighborhood and exercise and regular diet, as long as social distancing can be maintained.  Primary Physician/Referring:  Chesley Noon, MD  Patient ID: Eric Lint., male    DOB: 08/10/1955, 63 y.o.   MRN: [id number]  Chief complaints: Follow-up of hypertension and hyperlipidemia and abnormal stress test.  HPI:    Eric Demarest.  is a 63 y.o. Caucasian male with hypertension and mild hyperlipidemia, abnormal nuclear stress test in 2018 revealing mild anterior ischemia presents for annual visit. He does have back and spine issues but does exercise regularly on the treadmill at a moderate speed. He has no symptoms with this.  Echocardiogram which was done on 04/12/2017 which revealed mildly reduced LVEF and mild LVH, EF  45-50%. There is also mild to moderate aortic regurgitation. Nuclear stress had revealed very mild anterior ischemia, but due to excellent exercise tolerence, I had recommended medical therapy and started statins as well.  Recently he had complained of worsening myalgias, hence Crestor was held.  Patient states that he has not noticed any improvement in muscle spasm in his back and would like to restart Crestor.  Otherwise remains asymptomatic without chest pain or shortness of breath. Past Medical History:  Diagnosis Date  . Cancer determined by colorectal biopsy ([place])   . Depression    reactive  . History of chicken pox   . Hyperlipidemia   . Hypertension   . Spinal stenosis    Past Surgical History:  Procedure Laterality Date  . BACK SURGERY     foraminotomy  . COLONOSCOPY    . HERNIA REPAIR    . TONSILLECTOMY  1977   Social History   Socioeconomic History  . Marital status: Married    Spouse name: Not on file  . Number of children: 0  . Years of education: Not on file  . Highest education level: Not on file  Occupational History  . Occupation: Probation officer  Social Needs  . Financial resource strain: Not on file  . Food insecurity    Worry: Not on file    Inability: Not on file  . Transportation needs    Medical: Not on file    Non-medical: Not on file  Tobacco Use  . Smoking status: Never Smoker  . Smokeless tobacco: Never Used  Substance and Sexual Activity  . Alcohol use: Yes    Alcohol/week:  7.0 standard drinks    Types: 7 Standard drinks or equivalent per week    Comment: wine nightly  . Drug use: Not on file  . Sexual activity: Yes  Lifestyle  . Physical activity    Days per week: Not on file    Minutes per session: Not on file  . Stress: Not on file  Relationships  . Social Herbalist on phone: Not on file    Gets together: Not on file    Attends religious service: Not on file    Active member of club or organization: Not on file    Attends  meetings of clubs or organizations: Not on file    Relationship status: Not on file  . Intimate partner violence    Fear of current or ex partner: Not on file    Emotionally abused: Not on file    Physically abused: Not on file    Forced sexual activity: Not on file  Other Topics Concern  . Not on file  Social History Narrative  . Not on file   ROS  Review of Systems  Constitution: Negative for chills, decreased appetite, malaise/fatigue and weight gain.  Cardiovascular: Negative for dyspnea on exertion, leg swelling and syncope.  Endocrine: Negative for cold intolerance.  Hematologic/Lymphatic: Does not bruise/bleed easily.  Musculoskeletal: Positive for back pain and muscle cramps (upper back). Negative for joint swelling.  Gastrointestinal: Negative for abdominal pain, anorexia, change in bowel habit, hematochezia and melena.  Neurological: Negative for headaches and light-headedness.  Psychiatric/Behavioral: Negative for depression and substance abuse.  All other systems reviewed and are negative.  Objective  Blood pressure 110/69, pulse 79, height 5' 8"  (1.727 m), weight 161 lb (73 kg). Body mass index is 24.48 kg/m.  Physical exam not performed or limited due to virtual visit.  Patient appeared to be in no distress, Neck was supple, respiration was not labored.  Please see exam details from prior visit is as below.  Physical Exam  Constitutional: He appears well-developed and well-nourished. No distress.  HENT:  Head: Atraumatic.  Eyes: Conjunctivae are normal.  Neck: Neck supple. No JVD present. No thyromegaly present.  Cardiovascular: Normal rate, regular rhythm, normal heart sounds and intact distal pulses. Exam reveals no gallop.  No murmur heard. Pulmonary/Chest: Effort normal and breath sounds normal.  Abdominal: Soft. Bowel sounds are normal.  Musculoskeletal: Normal range of motion.  Neurological: He is alert.  Skin: Skin is warm and dry.  Psychiatric: He has  a normal mood and affect.   Radiology: No results found.  Laboratory examination:   CMP14+CBC/D/Plt+TSHResulted: 01/16/2019 4:35 AM [facility] Component Name Value Ref Range  Glucose 95 65 - 99 mg/dL  BUN 12 8 - 27 mg/dL  Creatinine, Serum 0.84 0.76 - 1.27 mg/dL  eGFR If NonAfrican American 93 >59 mL/min/1.73  eGFR If African American 107 >59 mL/min/1.73  BUN/Creatinine Ratio 14 10 - 24   Sodium 144 134 - 144 mmol/L  Potassium 5.1 3.5 - 5.2 mmol/L  Chloride 106 96 - 106 mmol/L  CO2 23 20 - 29 mmol/L  CALCIUM 9.9 8.6 - 10.2 mg/dL  Total Protein 7.2 6 - 8.5 g/dL  Albumin, Serum 4.6 3.8 - 4.8 g/dL  Globulin, Total 2.6 1.5 - 4.5 g/dL  Albumin/Globulin Ratio 1.8 1.2 - 2.2   Total Bilirubin 0.7 0 - 1.2 mg/dL  Alkaline Phosphatase 56 39 - 117 IU/L  AST 20 0 - 40 IU/L  ALT (SGPT) 18 0 -  44 IU/L  TSH 2.570 0.45 - 4.5 uIU/mL  WBC 6.4 3.4 - 10.8 x10E3/uL  RBC 4.66 4.14 - 5.8 x10E6/uL  Hemoglobin 14.8 13 - 17.7 g/dL  Hematocrit 42.7 37.5 - 51 %  MCV 92 79 - 97 fL  MCH 31.8 26.6 - 33 pg  MCHC 34.7 31.5 - 35.7 g/dL  RDW 13.2 11.6 - 15.4 %  Platelet Count 161 150 - 450 x10E3/uL   Lipid panelResulted: 01/16/2019 4:35 AM [facility] Component Name Value Ref Range  Cholesterol, Total 130 100 - 199 mg/dL  Triglycerides 85 0 - 149 mg/dL  HDL 67 >39 mg/dL  VLDL Cholesterol Cal 17 5 - 40 mg/dL  LDL 46 0 - 99 mg/dL    CMP Latest Ref Rng & Units 09/18/2017 07/15/2012 01/26/2011  Glucose 65 - 99 mg/dL 102(H) 101(H) 77  BUN 6 - 20 mg/dL 16 17 14   Creatinine 0.61 - 1.24 mg/dL 0.67 0.7 0.7  Sodium 135 - 145 mmol/L 141 138 140  Potassium 3.5 - 5.1 mmol/L 4.3 4.4 4.6  Chloride 101 - 111 mmol/L 107 105 105  CO2 22 - 32 mmol/L 27 28 29   Calcium 8.9 - 10.3 mg/dL 9.4 9.3 9.4  Total Protein 6.0 - 8.3 g/dL - 6.9 7.3  Total Bilirubin 0.3 - 1.2 mg/dL - 1.1 0.5  Alkaline Phos 39 - 117 U/L - 59 63  AST 0 - 37 U/L - 14 18  ALT 0 - 53 U/L - 15 18   CBC Latest Ref Rng & Units 09/18/2017  07/15/2012 01/26/2011  WBC 4.0 - 10.5 K/uL 6.0 5.1 5.3  Hemoglobin 13.0 - 17.0 g/dL 14.4 14.2 15.0  Hematocrit 39.0 - 52.0 % 42.2 42.0 44.2  Platelets 150 - 400 K/uL 144(L) 156.0 152.0   Lipid Panel     Component Value Date/Time   CHOL 200 07/15/2012 1053   TRIG 155.0 (H) 07/15/2012 1053   HDL 53.20 07/15/2012 1053   CHOLHDL 4 07/15/2012 1053   VLDL 31.0 07/15/2012 1053   LDLCALC 116 (H) 07/15/2012 1053   HEMOGLOBIN A1C No results found for: HGBA1C, MPG TSH No results for input(s): TSH in the last 8760 hours. Medications   Current Outpatient Medications  Medication Instructions  . ALPRAZolam (XANAX) 1 MG tablet TAKE 1 TABLET BY MOUTH AT BEDTIME AS NEEDED FOR SLEEP  . Ascorbic Acid (VITAMIN C PO) Oral  . aspirin 81 mg, Oral, Daily  . BETA CAROTENE PO Oral  . BIOTIN PO Take by mouth.  . clorazepate (TRANXENE) 7.5 MG tablet Oral, As needed  . Coenzyme Q10 (CO Q 10 PO) Oral  . Multiple Vitamin (MULTIVITAMIN) tablet 1 tablet, Daily  . olmesartan (BENICAR) 20 MG tablet TAKE 1 TABLET DAILY  . rosuvastatin (CRESTOR) 10 MG tablet Holding muscle aches  . traMADol (ULTRAM) 50 mg, Oral, 4 times daily  . tretinoin (RETIN-A) 0.1 % cream 3-4 x week  . Vitamin D, Cholecalciferol, 1000 UNITS TABS Take by mouth.    Cardiac Studies:   Exercise sestamibi stress test 04/26/2017:  1. Resting EKG demonstrates normal sinus rhythm, right bundle branch block. Stress EKG is negative for myocardial ischemia. Patient exercised for 9 minutes and achieved 10.16 mets. Stress terminated due to achieving target heart rate, 89% of MPHR. No symptoms reported. Normal blood pressure response. Normal exercise capacity.  2. Perfusion imaging study demonstrates mild perfusion abnormality at the basal anterolateral wall which does not reach statistical significance. Minimal ischemia in this region cannot be completely excluded. Normal LV  systolic function at 47% without wall motion abnormality. Low risk study.   Echocardiogram [04/12/2017]:  Left ventricle cavity is normal in size. Mild concentric hypertrophy of the left ventricle. Mild decrease in LV systolic function with global hypokinesis. Visual EF is 45-50%. Calculated EF 50%. Trileaflet aortic valve with mild to moderate regurgitation.   Assessment     ICD-10-CM   1. Pure hypercholesterolemia  E78.00   2. Myalgia  M79.10   3. Essential hypertension  I10   4. Abnormal nuclear stress test  R94.39   5. 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 No evidence of ischemia. No significant change from EKG 04/10/2017.  Recommendations:   Patient requested a virtual visit in view of COVID-19.  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  He has had Crestor for 2 weeks and he has not noticed any improvement in myalgia.  Hence elected to restart the medication.  I have recommended that we check vitamin D level as it has been a while since it was previously checked.  Blood pressure is very well controlled.  He has not had any chest pain or shortness of breath and he is on aggressive medical therapy for mild ischemia noted on the stress test.  I would not recommend any further cardiac testing, advised him to continue to exercise regularly and I will see him back in 6 months for follow-up.  He does have mild to moderate aortic regurgitation but I do not think he needs echocardiogram at this point, I will consider this on his next office visit.  I have reviewed his labs from the PCP, lipids under excellent control, LFTs are also normal.   Eric Prows, MD, [facility] 02/09/2019, 2:43 PM [facility] [place] Pager: [phone] Office: [phone] If no answer Cell  [phone]

## 2019-02-09 NOTE — Telephone Encounter (Signed)
Please read

## 2019-02-09 NOTE — Progress Notes (Addendum)
Virtual Visit via Video Note: This visit type was conducted due to national recommendations for restrictions regarding the COVID-19 Pandemic (e.g. social distancing).  This format is felt to be most appropriate for this patient at this time.  All issues noted in this document were discussed and addressed.  No physical exam was performed (except for noted visual exam findings with Telehealth visits).  The patient has consented to conduct a Telehealth visit and understands insurance will be billed.   I connected with@, on 02/09/19 at  by a video enabled telemedicine application and verified that I am speaking with the correct person using two identifiers.   I discussed the limitations of evaluation and management by telemedicine and the availability of in person appointments. The patient expressed understanding and agreed to proceed.   I have discussed with patient regarding the safety during COVID Pandemic and steps and precautions to be taken including social distancing, frequent hand wash and use of detergent soap, gels with the patient. I asked the patient to avoid touching mouth, nose, eyes, ears with the hands. I encouraged regular walking around the neighborhood and exercise and regular diet, as long as social distancing can be maintained.  Primary Physician/Referring:  Chesley Noon, MD  Patient ID: Almond Lint., male    DOB: 1955-09-24, 63 y.o.   MRN: [id number]  Chief complaints: Follow-up of hypertension and hyperlipidemia and abnormal stress test.  HPI:    Kyair Ditommaso.  is a 63 y.o. Caucasian male with hypertension and mild hyperlipidemia, abnormal nuclear stress test in 2018 revealing mild anterior ischemia presents for annual visit. He does have back and spine issues but does exercise regularly on the treadmill at a moderate speed. He has no symptoms with this.  Echocardiogram which was done on 04/12/2017 which revealed mildly reduced LVEF and mild LVH, EF  45-50%. There is also mild to moderate aortic regurgitation. Nuclear stress had revealed very mild anterior ischemia, but due to excellent exercise tolerence, I had recommended medical therapy and started statins as well.  Recently he had complained of worsening myalgias, hence Crestor was held.  Patient states that he has not noticed any improvement in muscle spasm in his back and would like to restart Crestor.  Otherwise remains asymptomatic without chest pain or shortness of breath. Past Medical History:  Diagnosis Date   Cancer determined by colorectal biopsy ([place])    Depression    reactive   History of chicken pox    Hyperlipidemia    Hypertension    Spinal stenosis    Past Surgical History:  Procedure Laterality Date   BACK SURGERY     foraminotomy   COLONOSCOPY     HERNIA REPAIR     TONSILLECTOMY  1977   Social History   Socioeconomic History   Marital status: Married    Spouse name: Not on file   Number of children: 0   Years of education: Not on file   Highest education level: Not on file  Occupational History   Occupation: Probation officer  Social Designer, fashion/clothing strain: Not on file   Food insecurity    Worry: Not on file    Inability: Not on file   Transportation needs    Medical: Not on file    Non-medical: Not on file  Tobacco Use   Smoking status: Never Smoker   Smokeless tobacco: Never Used  Substance and Sexual Activity   Alcohol use: Yes    Alcohol/week:  7.0 standard drinks    Types: 7 Standard drinks or equivalent per week    Comment: wine nightly   Drug use: Not on file   Sexual activity: Yes  Lifestyle   Physical activity    Days per week: Not on file    Minutes per session: Not on file   Stress: Not on file  Relationships   Social connections    Talks on phone: Not on file    Gets together: Not on file    Attends religious service: Not on file    Active member of club or organization: Not on file    Attends  meetings of clubs or organizations: Not on file    Relationship status: Not on file   Intimate partner violence    Fear of current or ex partner: Not on file    Emotionally abused: Not on file    Physically abused: Not on file    Forced sexual activity: Not on file  Other Topics Concern   Not on file  Social History Narrative   Not on file   ROS  Review of Systems  Constitution: Negative for chills, decreased appetite, malaise/fatigue and weight gain.  Cardiovascular: Negative for dyspnea on exertion, leg swelling and syncope.  Endocrine: Negative for cold intolerance.  Hematologic/Lymphatic: Does not bruise/bleed easily.  Musculoskeletal: Positive for back pain and muscle cramps (upper back). Negative for joint swelling.  Gastrointestinal: Negative for abdominal pain, anorexia, change in bowel habit, hematochezia and melena.  Neurological: Negative for headaches and light-headedness.  Psychiatric/Behavioral: Negative for depression and substance abuse.  All other systems reviewed and are negative.  Objective  Blood pressure 110/69, pulse 79, height _0  (1.727 m), weight 161 lb (73 kg). Body mass index is 24.48 kg/m.  Physical exam not performed or limited due to virtual visit.  Patient appeared to be in no distress, Neck was supple, respiration was not labored.  Please see exam details from prior visit is as below.  Physical Exam  Constitutional: He appears well-developed and well-nourished. No distress.  HENT:  Head: Atraumatic.  Eyes: Conjunctivae are normal.  Neck: Neck supple. No JVD present. No thyromegaly present.  Cardiovascular: Normal rate, regular rhythm, normal heart sounds and intact distal pulses. Exam reveals no gallop.  No murmur heard. Pulmonary/Chest: Effort normal and breath sounds normal.  Abdominal: Soft. Bowel sounds are normal.  Musculoskeletal: Normal range of motion.  Neurological: He is alert.  Skin: Skin is warm and dry.  Psychiatric: He has  a normal mood and affect.   Radiology: No results found.  Laboratory examination:   CMP14+CBC/D/Plt+TSHResulted: 01/16/2019 4:35 AM [facility] Component Name Value Ref Range  Glucose 95 65 - 99 mg/dL  BUN 12 8 - 27 mg/dL  Creatinine, Serum 0.84 0.76 - 1.27 mg/dL  eGFR If NonAfrican American 93 >59 mL/min/1.73  eGFR If African American 107 >59 mL/min/1.73  BUN/Creatinine Ratio 14 10 - 24   Sodium 144 134 - 144 mmol/L  Potassium 5.1 3.5 - 5.2 mmol/L  Chloride 106 96 - 106 mmol/L  CO2 23 20 - 29 mmol/L  CALCIUM 9.9 8.6 - 10.2 mg/dL  Total Protein 7.2 6 - 8.5 g/dL  Albumin, Serum 4.6 3.8 - 4.8 g/dL  Globulin, Total 2.6 1.5 - 4.5 g/dL  Albumin/Globulin Ratio 1.8 1.2 - 2.2   Total Bilirubin 0.7 0 - 1.2 mg/dL  Alkaline Phosphatase 56 39 - 117 IU/L  AST 20 0 - 40 IU/L  ALT (SGPT) 18 0 -  44 IU/L  TSH 2.570 0.45 - 4.5 uIU/mL  WBC 6.4 3.4 - 10.8 x10E3/uL  RBC 4.66 4.14 - 5.8 x10E6/uL  Hemoglobin 14.8 13 - 17.7 g/dL  Hematocrit 42.7 37.5 - 51 %  MCV 92 79 - 97 fL  MCH 31.8 26.6 - 33 pg  MCHC 34.7 31.5 - 35.7 g/dL  RDW 13.2 11.6 - 15.4 %  Platelet Count 161 150 - 450 x10E3/uL   Lipid panelResulted: 01/16/2019 4:35 AM [facility] Component Name Value Ref Range  Cholesterol, Total 130 100 - 199 mg/dL  Triglycerides 85 0 - 149 mg/dL  HDL 67 >39 mg/dL  VLDL Cholesterol Cal 17 5 - 40 mg/dL  LDL 46 0 - 99 mg/dL    CMP Latest Ref Rng & Units 09/18/2017 07/15/2012 01/26/2011  Glucose 65 - 99 mg/dL 102(H) 101(H) 77  BUN 6 - 20 mg/dL _0 Creatinine 0.61 - 1.24 mg/dL 0.67 0.7 0.7  Sodium 135 - 145 mmol/L 141 138 140  Potassium 3.5 - 5.1 mmol/L 4.3 4.4 4.6  Chloride 101 - 111 mmol/L 107 105 105  CO2 22 - 32 mmol/L _1 Calcium 8.9 - 10.3 mg/dL 9.4 9.3 9.4  Total Protein 6.0 - 8.3 g/dL - 6.9 7.3  Total Bilirubin 0.3 - 1.2 mg/dL - 1.1 0.5  Alkaline Phos 39 - 117 U/L - 59 63  AST 0 - 37 U/L - 14 18  ALT 0 - 53 U/L - 15 18   CBC Latest Ref Rng & Units 09/18/2017  07/15/2012 01/26/2011  WBC 4.0 - 10.5 K/uL 6.0 5.1 5.3  Hemoglobin 13.0 - 17.0 g/dL 14.4 14.2 15.0  Hematocrit 39.0 - 52.0 % 42.2 42.0 44.2  Platelets 150 - 400 K/uL 144(L) 156.0 152.0   Lipid Panel     Component Value Date/Time   CHOL 200 07/15/2012 1053   TRIG 155.0 (H) 07/15/2012 1053   HDL 53.20 07/15/2012 1053   CHOLHDL 4 07/15/2012 1053   VLDL 31.0 07/15/2012 1053   LDLCALC 116 (H) 07/15/2012 1053   HEMOGLOBIN A1C No results found for: HGBA1C, MPG TSH No results for input(s): TSH in the last 8760 hours. Medications   Current Outpatient Medications  Medication Instructions   ALPRAZolam (XANAX) 1 MG tablet TAKE 1 TABLET BY MOUTH AT BEDTIME AS NEEDED FOR SLEEP   Ascorbic Acid (VITAMIN C PO) Oral   aspirin 81 mg, Oral, Daily   BETA CAROTENE PO Oral   BIOTIN PO Take by mouth.   clorazepate (TRANXENE) 7.5 MG tablet Oral, As needed   Coenzyme Q10 (CO Q 10 PO) Oral   Multiple Vitamin (MULTIVITAMIN) tablet 1 tablet, Daily   olmesartan (BENICAR) 20 MG tablet TAKE 1 TABLET DAILY   rosuvastatin (CRESTOR) 10 MG tablet Holding muscle aches   traMADol (ULTRAM) 50 mg, Oral, 4 times daily   tretinoin (RETIN-A) 0.1 % cream 3-4 x week   Vitamin D, Cholecalciferol, 1000 UNITS TABS Take by mouth.    Cardiac Studies:   Exercise sestamibi stress test 04/26/2017:  1. Resting EKG demonstrates normal sinus rhythm, right bundle branch block. Stress EKG is negative for myocardial ischemia. Patient exercised for 9 minutes and achieved 10.16 mets. Stress terminated due to achieving target heart rate, 89% of MPHR. No symptoms reported. Normal blood pressure response. Normal exercise capacity.  2. Perfusion imaging study demonstrates mild perfusion abnormality at the basal anterolateral wall which does not reach statistical significance. Minimal ischemia in this region cannot be completely excluded. Normal LV  systolic function at 21% without wall motion abnormality. Low risk  study.  Echocardiogram [04/12/2017]:  Left ventricle cavity is normal in size. Mild concentric hypertrophy of the left ventricle. Mild decrease in LV systolic function with global hypokinesis. Visual EF is 45-50%. Calculated EF 50%. Trileaflet aortic valve with mild to moderate regurgitation.   Assessment     ICD-10-CM   1. Pure hypercholesterolemia  E78.00   2. Myalgia  M79.10   3. Essential hypertension  I10   4. Abnormal nuclear stress test  R94.39     EKG 05/08/2018: Normal sinus rhythm at rate 64 bpm, leftward axis, incomplete right bundle branch block. No evidence of ischemia. No significant change from EKG 04/10/2017.  Recommendations:   Patient requested a virtual visit in view of COVID-19.  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  He has had Crestor for 2 weeks and he has not noticed any improvement in myalgia.  Hence elected to restart the medication.  I have recommended that we check vitamin D level as it has been a while since it was previously checked.  Blood pressure is very well controlled.  He has not had any chest pain or shortness of breath and he is on aggressive medical therapy for mild ischemia noted on the stress test.  I would not recommend any further cardiac testing, advised him to continue to exercise regularly and I will see him back in 6 months for follow-up.  He does have mild to moderate aortic regurgitation but I do not think he needs echocardiogram at this point, I will consider this on his next office visit.  I have reviewed his labs from the PCP, lipids under excellent control, LFTs are also normal.   Adrian Prows, MD, [facility] 02/09/2019, 2:27 PM [facility]. [place] Pager: [phone] Office: [phone] If no answer Cell [phone]

## 2019-02-12 ENCOUNTER — Encounter (HOSPITAL_BASED_OUTPATIENT_CLINIC_OR_DEPARTMENT_OTHER): Payer: Self-pay

## 2019-02-12 ENCOUNTER — Ambulatory Visit (HOSPITAL_BASED_OUTPATIENT_CLINIC_OR_DEPARTMENT_OTHER): Admit: 2019-02-12 | Payer: BLUE CROSS/BLUE SHIELD | Admitting: General Surgery

## 2019-02-12 SURGERY — ANOSCOPY, HIGH RESOLUTION
Anesthesia: Monitor Anesthesia Care

## 2019-05-04 ENCOUNTER — Ambulatory Visit: Payer: Self-pay | Admitting: Cardiology

## 2019-07-16 ENCOUNTER — Ambulatory Visit: Payer: Self-pay | Admitting: Cardiology

## 2019-07-16 ENCOUNTER — Other Ambulatory Visit: Payer: Self-pay | Admitting: Cardiology

## 2019-07-22 ENCOUNTER — Ambulatory Visit: Payer: BC Managed Care – PPO

## 2019-07-22 IMAGING — CT CT HEAD W/O CM
4 series · 17 of 47 positions shown, 19 images · non-contrast
Comparison: None.

CLINICAL DATA: Left-sided headaches for several weeks

EXAM:
CT HEAD WITHOUT CONTRAST
TECHNIQUE: Contiguous axial images were obtained from the base of the skull
through the vertex without intravenous contrast.

[Series 3: head without · axial · non-contrast · 0.48mm/px · z∈[-53,+77]mm · 7 of 36 slices shown, 9 images]
[im 5/36  brain]
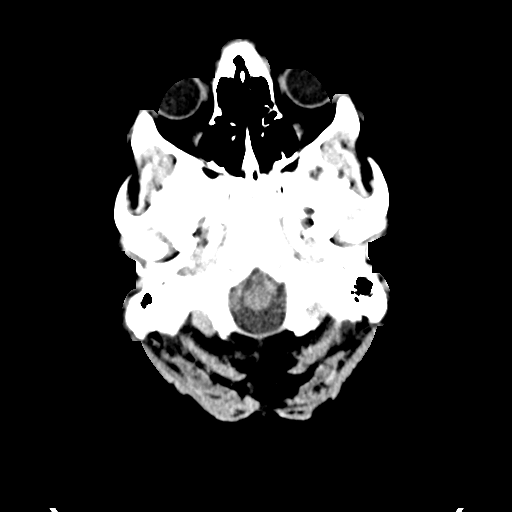
[im 5/36  bone]
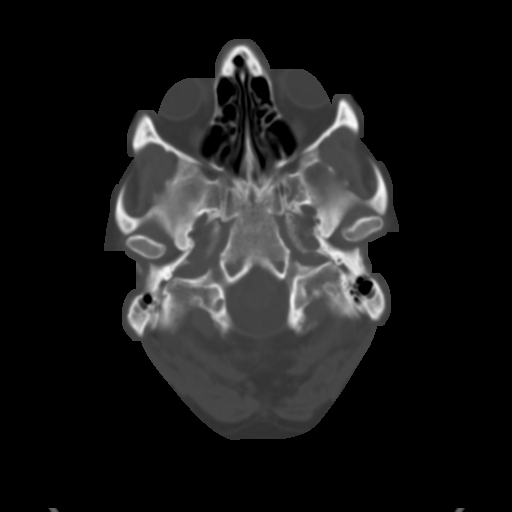
[im 9/36  brain]
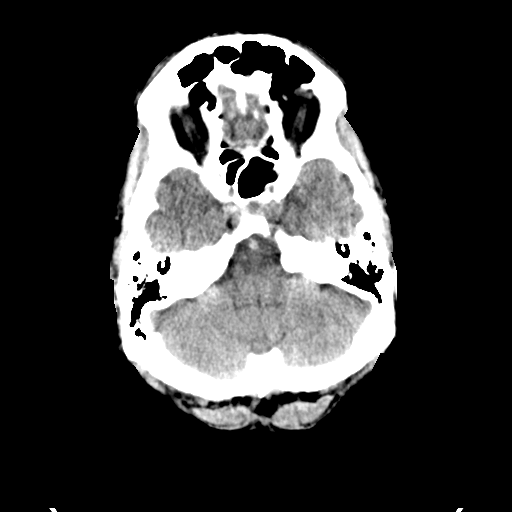
[im 14/36  brain]
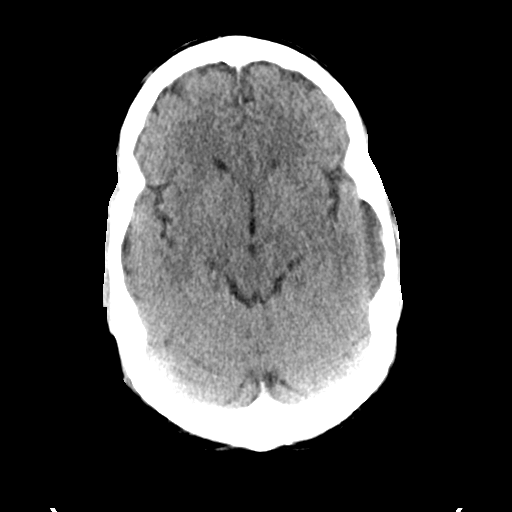
[im 18/36  brain]
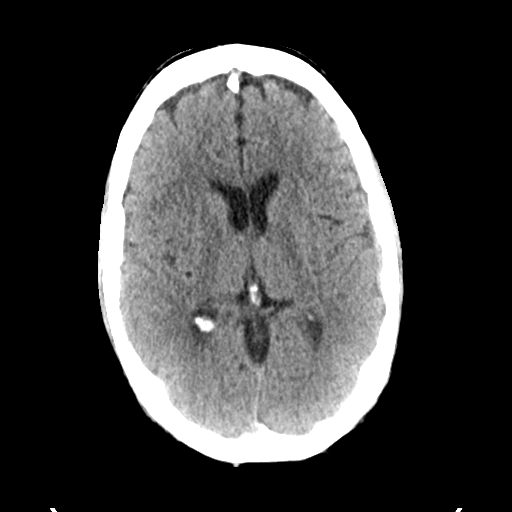
[im 22/36  brain]
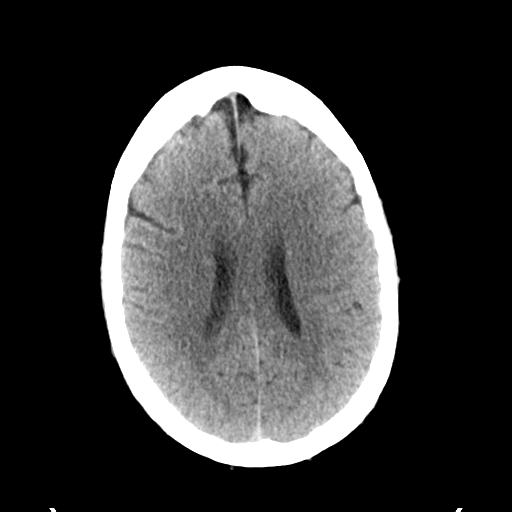
[im 22/36  bone]
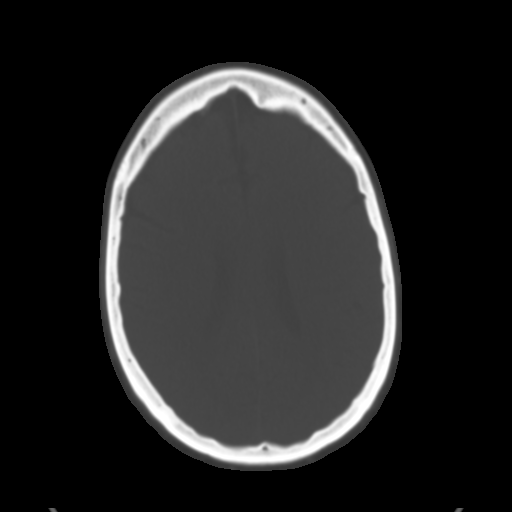
[im 27/36  brain]
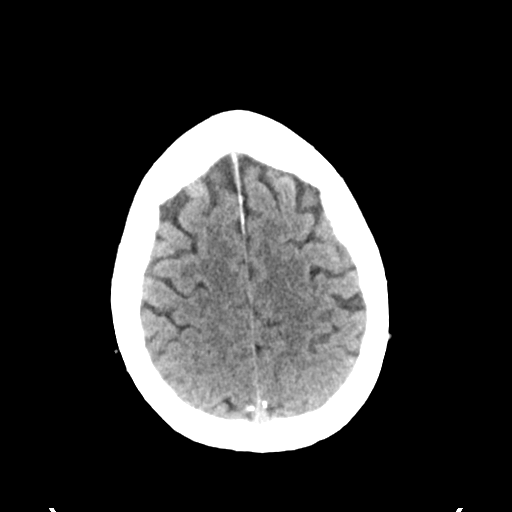
[im 31/36  brain]
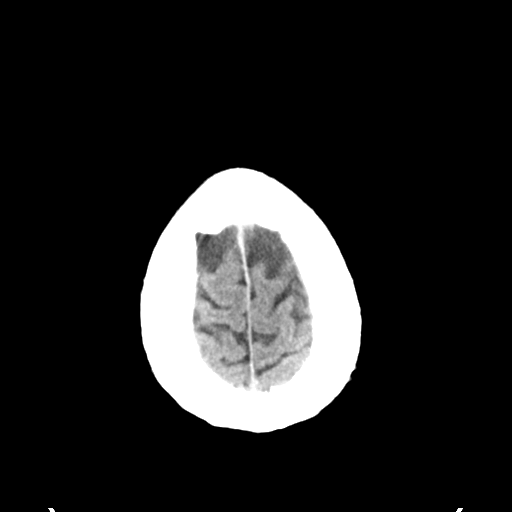

[Series 4: head bone · axial · 0.48mm/px · z∈[-57,+7]mm · 4 of 90 slices shown]
[im 9/90  bone]
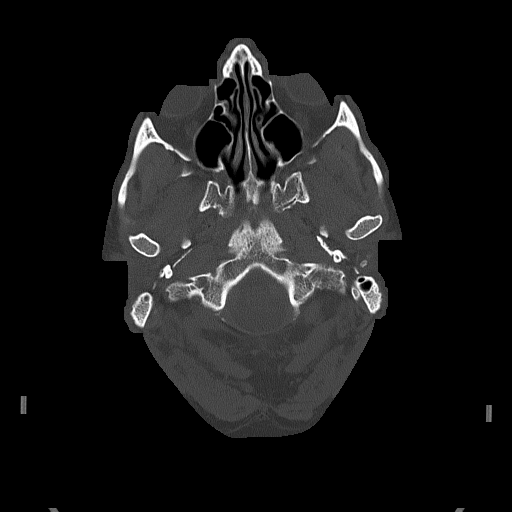
[im 18/90  bone]
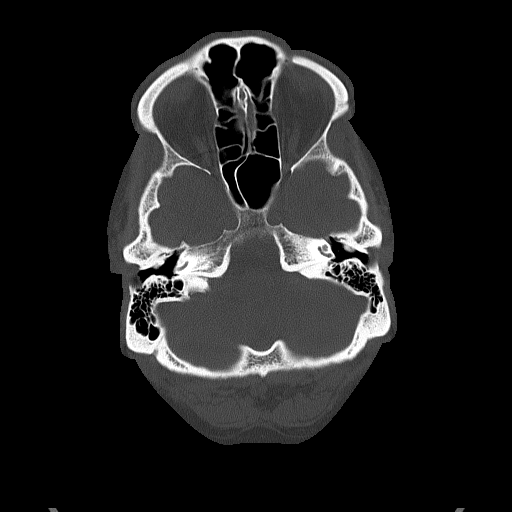
[im 27/90  bone]
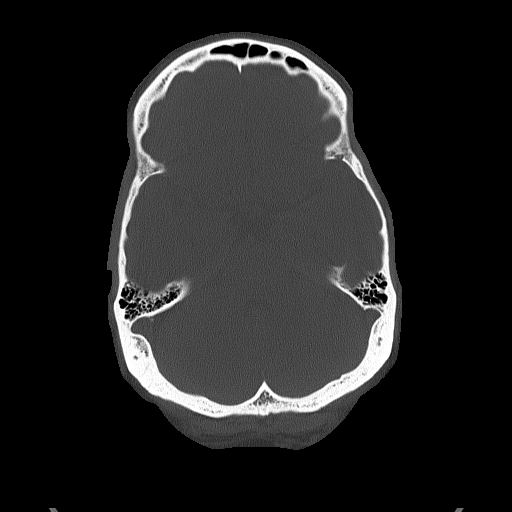
[im 41/90  bone]
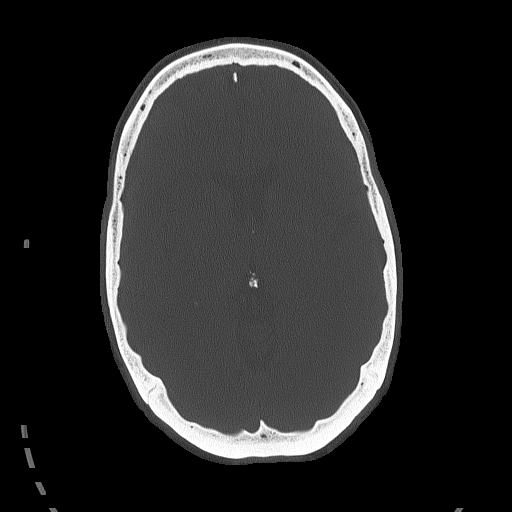

[Series 5: head without cor · coronal · non-contrast · 0.32mm/px · 3 of 67 slices shown]
[im 23/67  brain]
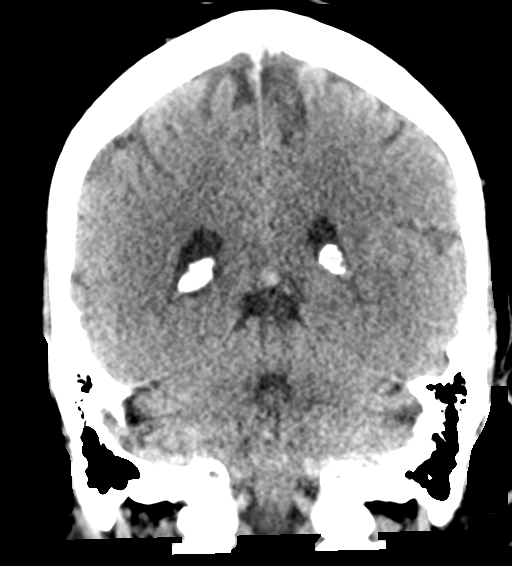
[im 30/67  brain]
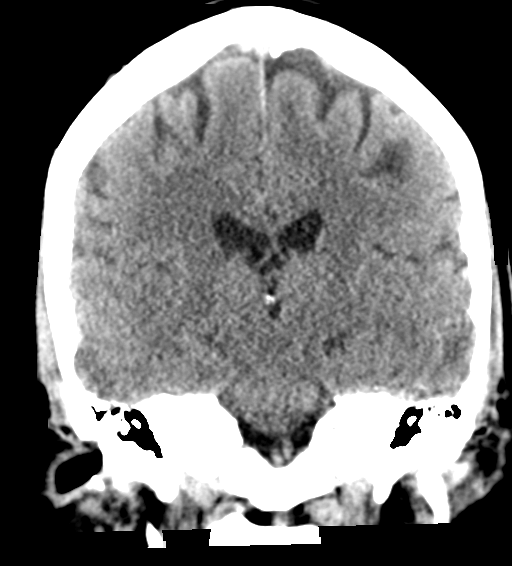
[im 37/67  brain]
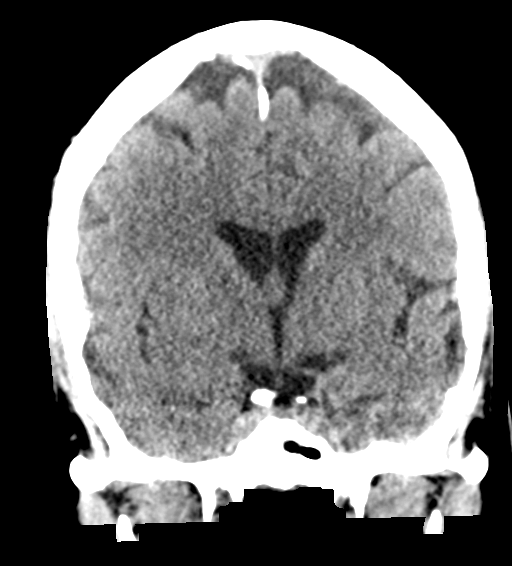

[Series 6: head without sag · sagittal · non-contrast · 0.35mm/px · 3 of 52 slices shown]
[im 18/52  brain]
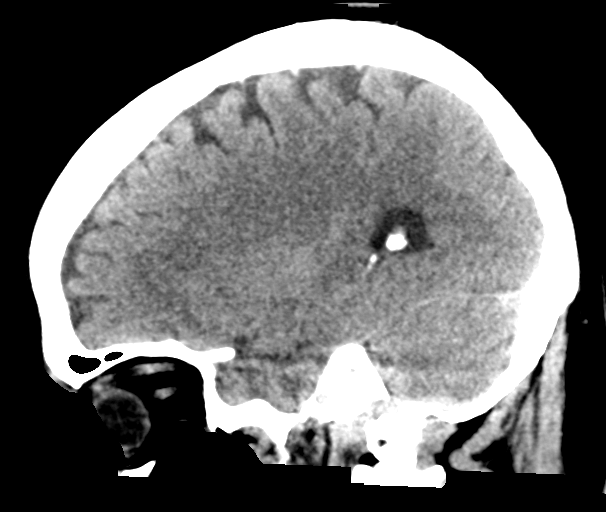
[im 26/52  brain]
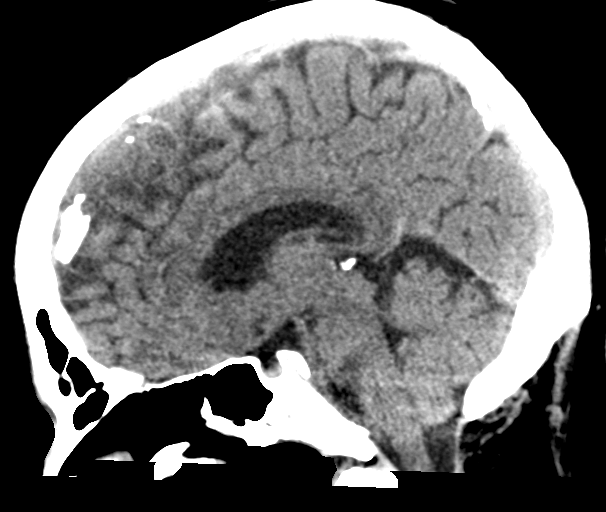
[im 35/52  brain]
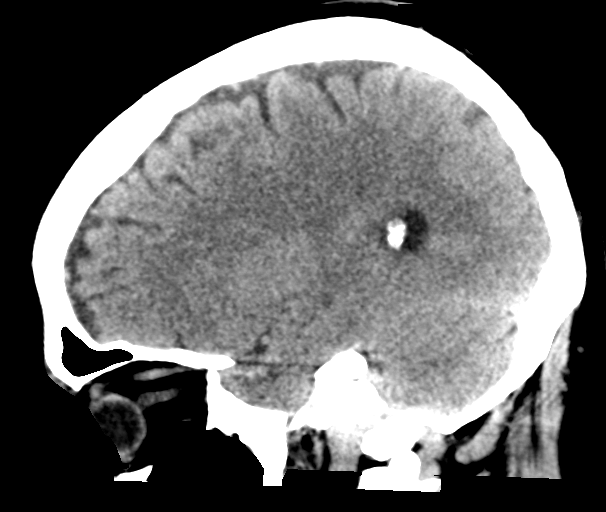

[17 of 47 positions shown; findings below may reference images not displayed]

FINDINGS: Brain: No evidence of acute infarction, hemorrhage, hydrocephalus,
extra-axial collection or mass lesion/mass effect.

Vascular: No hyperdense vessel or unexpected calcification.

Skull: Normal. Negative for fracture or focal lesion.

Sinuses/Orbits: No acute finding.

Other: None.
IMPRESSION: No acute intracranial abnormality noted.

## 2019-07-24 ENCOUNTER — Ambulatory Visit: Payer: BC Managed Care – PPO | Attending: Internal Medicine

## 2019-07-24 DIAGNOSIS — Z23 Encounter for immunization: Secondary | ICD-10-CM | POA: Insufficient documentation

## 2019-07-24 NOTE — Progress Notes (Signed)
   [id number]  Name:  Eric Parks.    MRN: [id number] DOB: 1955-11-19  07/24/2019  Eric Parks was observed post Covid-19 immunization for 15 minutes without incidence. He was provided with Vaccine Information Sheet and instruction to access the V-Safe system.   Eric Parks was instructed to call [phone] with any severe reactions post vaccine: Marland Kitchen Difficulty breathing  . Swelling of your face and throat  . A fast heartbeat  . A bad rash all over your body  . Dizziness and weakness    Immunizations Administered    Name Date Dose VIS Date Route   Pfizer COVID-19 Vaccine 07/24/2019 11:18 AM 0.3 mL 06/12/2019 Intramuscular   Manufacturer: [place]   Lot: [id number]   [place]: [id number]

## 2019-08-14 ENCOUNTER — Ambulatory Visit: Payer: BC Managed Care – PPO | Attending: Internal Medicine

## 2019-08-14 DIAGNOSIS — Z23 Encounter for immunization: Secondary | ICD-10-CM

## 2019-09-03 ENCOUNTER — Other Ambulatory Visit: Payer: Self-pay | Admitting: Cardiology

## 2019-09-03 DIAGNOSIS — E7800 Pure hypercholesterolemia, unspecified: Secondary | ICD-10-CM

## 2019-09-03 DIAGNOSIS — I10 Essential (primary) hypertension: Secondary | ICD-10-CM

## 2019-09-03 NOTE — Telephone Encounter (Signed)
Does the patient need labs drawn? If so can you please put the orders in? Please advise.

## 2019-09-11 LAB — LIPID PANEL WITH LDL/HDL RATIO
Cholesterol, Total: 140 mg/dL (ref 100–199)
HDL: 68 mg/dL (ref >39)
LDL Chol Calc (NIH): 57 mg/dL (ref 0–99)
LDL/HDL Ratio: 0.8 ratio (ref 0.0–3.6)
Triglycerides: 77 mg/dL (ref 0–149)
VLDL Cholesterol Cal: 15 mg/dL (ref 5–40)

## 2019-09-11 LAB — CMP14+EGFR
ALT: 18 IU/L (ref 0–44)
AST: 24 IU/L (ref 0–40)
Albumin/Globulin Ratio: 2 (ref 1.2–2.2)
Albumin: 4.7 g/dL (ref 3.8–4.8)
Alkaline Phosphatase: 65 IU/L (ref 39–117)
BUN/Creatinine Ratio: 18 (ref 10–24)
BUN: 14 mg/dL (ref 8–27)
Bilirubin Total: 0.8 mg/dL (ref 0.0–1.2)
CO2: 24 mmol/L (ref 20–29)
Calcium: 9.7 mg/dL (ref 8.6–10.2)
Chloride: 105 mmol/L (ref 96–106)
Creatinine, Ser: 0.79 mg/dL (ref 0.76–1.27)
GFR calc Af Amer: 109 mL/min/{1.73_m2} (ref >59)
GFR calc non Af Amer: 94 mL/min/{1.73_m2} (ref >59)
Globulin, Total: 2.4 g/dL (ref 1.5–4.5)
Glucose: 92 mg/dL (ref 65–99)
Potassium: 4.6 mmol/L (ref 3.5–5.2)
Sodium: 142 mmol/L (ref 134–144)
Total Protein: 7.1 g/dL (ref 6.0–8.5)

## 2019-09-11 LAB — LDL CHOLESTEROL, DIRECT: LDL Direct: 51 mg/dL (ref 0–99)

## 2019-09-11 LAB — LIPOPROTEIN A (LPA): Lipoprotein (a): <8.4 nmol/L (ref <75.0)

## 2019-09-17 ENCOUNTER — Other Ambulatory Visit: Payer: Self-pay

## 2019-09-17 ENCOUNTER — Encounter: Payer: Self-pay | Admitting: Cardiology

## 2019-09-17 ENCOUNTER — Ambulatory Visit: Payer: Medicare Other | Admitting: Cardiology

## 2019-09-17 VITALS — BP 122/79 | HR 85 | Temp 97.0°F | Ht 68.0 in | Wt 170.8 lb

## 2019-09-17 DIAGNOSIS — R9439 Abnormal result of other cardiovascular function study: Secondary | ICD-10-CM

## 2019-09-17 DIAGNOSIS — I351 Nonrheumatic aortic (valve) insufficiency: Secondary | ICD-10-CM

## 2019-09-17 DIAGNOSIS — I10 Essential (primary) hypertension: Secondary | ICD-10-CM

## 2019-09-17 DIAGNOSIS — I452 Bifascicular block: Secondary | ICD-10-CM

## 2019-09-17 DIAGNOSIS — E7800 Pure hypercholesterolemia, unspecified: Secondary | ICD-10-CM

## 2019-09-17 NOTE — Progress Notes (Signed)
Primary Physician/Referring:  Chesley Noon, MD  Patient ID: Eric Lint., male    DOB: [birth year], 64 y.o.   MRN: [id number]  Chief complaints: Follow-up of hypertension and hyperlipidemia and abnormal stress test.  HPI:    Eric Parks.  is a 64 y.o. Caucasian male with hypertension and mild hyperlipidemia, abnormal nuclear stress test in 2018 revealing mild anterior ischemia, echocardiogram revealing mild to moderate aortic regurgitation with LVEF 45 to 50% in 2018 presents for annual visit. He does have back and spine issues but does exercise regularly on the treadmill at a moderate speed. He has no symptoms with this.  He is now on aggressive medical therapy with statins.  He presents for annual visit, states that he is doing well.  Tolerating Crestor and Benicar. Otherwise remains asymptomatic without chest pain or shortness of breath.  Past Medical History:  Diagnosis Date  . Cancer determined by colorectal biopsy ([place])   . Depression    reactive  . History of chicken pox   . Hyperlipidemia   . Hypertension   . Spinal stenosis    Past Surgical History:  Procedure Laterality Date  . BACK SURGERY     foraminotomy  . COLONOSCOPY    . HERNIA REPAIR    . TONSILLECTOMY  1977   Social History   Tobacco Use  . Smoking status: Never Smoker  . Smokeless tobacco: Never Used  Substance Use Topics  . Alcohol use: Yes    Alcohol/week: 7.0 standard drinks    Types: 7 Standard drinks or equivalent per week    Comment: wine nightly    Family History  Problem Relation Age of Onset  . Arthritis Father   . Hyperlipidemia Father   . Heart disease Father   . Hypertension Father   . Diabetes Father   . Arthritis Mother   . Colon cancer Mother 23  . Stomach cancer Maternal Aunt        stomach cancer or ovarian  . Melanoma Maternal Grandfather   . Pancreatic cancer Neg Hx   . Rectal cancer Neg Hx   . Colon polyps Neg Hx   . Esophageal cancer Neg Hx      ROS  Review of Systems  Cardiovascular: Negative for chest pain, dyspnea on exertion and leg swelling.  Musculoskeletal: Positive for back pain and muscle cramps.  Gastrointestinal: Negative for melena.   Objective  Blood pressure 122/79, pulse 85, temperature (!) 97 F (36.1 C), height 5' 8"  (1.727 m), weight 170 lb 12.8 oz (77.5 kg), SpO2 98 %. Body mass index is 25.97 kg/m.  Vitals with BMI 09/17/2019 02/08/2019 09/05/2018  Height 5' 8"  5' 8"  -  Weight 170 lbs 13 oz 161 lbs -  BMI 51.10 21.11 -  Systolic 735 670 141  Diastolic 79 69 74  Pulse 85 79 74     Physical Exam  Constitutional: He appears well-developed and well-nourished. No distress.  HENT:  Head: Atraumatic.  Eyes: Conjunctivae are normal.  Neck: No JVD present. No thyromegaly present.  Cardiovascular: Normal rate, regular rhythm, intact distal pulses and normal pulses. Exam reveals no gallop.  Murmur heard.  Blowing decrescendo early diastolic murmur is present with a grade of 2/6 at the upper right sternal border. Pulmonary/Chest: Effort normal and breath sounds normal.  Abdominal: Soft. Bowel sounds are normal.  Musculoskeletal:        General: Normal range of motion.     Cervical back: Neck supple.  Neurological: He is alert.  Skin: Skin is warm and dry.  Psychiatric: He has a normal mood and affect.   Radiology: No results found.  Laboratory examination:   CMP Latest Ref Rng & Units 09/10/2019 09/18/2017 07/15/2012  Glucose 65 - 99 mg/dL 92 102(H) 101(H)  BUN 8 - 27 mg/dL 14 16 17   Creatinine 0.76 - 1.27 mg/dL 0.79 0.67 0.7  Sodium 134 - 144 mmol/L 142 141 138  Potassium 3.5 - 5.2 mmol/L 4.6 4.3 4.4  Chloride 96 - 106 mmol/L 105 107 105  CO2 20 - 29 mmol/L 24 27 28   Calcium 8.6 - 10.2 mg/dL 9.7 9.4 9.3  Total Protein 6.0 - 8.5 g/dL 7.1 - 6.9  Total Bilirubin 0.0 - 1.2 mg/dL 0.8 - 1.1  Alkaline Phos 39 - 117 IU/L 65 - 59  AST 0 - 40 IU/L 24 - 14  ALT 0 - 44 IU/L 18 - 15   CBC Latest Ref Rng &  Units 09/18/2017 07/15/2012 01/26/2011  WBC 4.0 - 10.5 K/uL 6.0 5.1 5.3  Hemoglobin 13.0 - 17.0 g/dL 14.4 14.2 15.0  Hematocrit 39.0 - 52.0 % 42.2 42.0 44.2  Platelets 150 - 400 K/uL 144(L) 156.0 152.0   Lipid Panel     Component Value Date/Time   CHOL 140 09/10/2019 1038   TRIG 77 09/10/2019 1038   HDL 68 09/10/2019 1038   CHOLHDL 4 07/15/2012 1053   VLDL 31.0 07/15/2012 1053   LDLCALC 57 09/10/2019 1038   LDLDIRECT 51 09/10/2019 1037   Lipoprotein (a) 09/09/2009 <75.0 nmol/L <8.4    CMP14+CBC/D/Plt+TSHResulted: 01/16/2019 4:35 AM [facility] Component Name Value Ref Range  Glucose 95 65 - 99 mg/dL  BUN 12 8 - 27 mg/dL  Creatinine, Serum 0.84 0.76 - 1.27 mg/dL  eGFR If NonAfrican American 93 >59 mL/min/1.73  eGFR If African American 107 >59 mL/min/1.73  BUN/Creatinine Ratio 14 10 - 24   Sodium 144 134 - 144 mmol/L  Potassium 5.1 3.5 - 5.2 mmol/L  Chloride 106 96 - 106 mmol/L  CO2 23 20 - 29 mmol/L  CALCIUM 9.9 8.6 - 10.2 mg/dL  Total Protein 7.2 6 - 8.5 g/dL  Albumin, Serum 4.6 3.8 - 4.8 g/dL  Globulin, Total 2.6 1.5 - 4.5 g/dL  Albumin/Globulin Ratio 1.8 1.2 - 2.2   Total Bilirubin 0.7 0 - 1.2 mg/dL  Alkaline Phosphatase 56 39 - 117 IU/L  AST 20 0 - 40 IU/L  ALT (SGPT) 18 0 - 44 IU/L  TSH 2.570 0.45 - 4.5 uIU/mL  WBC 6.4 3.4 - 10.8 x10E3/uL  RBC 4.66 4.14 - 5.8 x10E6/uL  Hemoglobin 14.8 13 - 17.7 g/dL  Hematocrit 42.7 37.5 - 51 %  MCV 92 79 - 97 fL  MCH 31.8 26.6 - 33 pg  MCHC 34.7 31.5 - 35.7 g/dL  RDW 13.2 11.6 - 15.4 %  Platelet Count 161 150 - 450 x10E3/uL   Medications   Current Outpatient Medications  Medication Instructions  . ALPRAZolam (XANAX) 1 MG tablet TAKE 1 TABLET BY MOUTH AT BEDTIME AS NEEDED FOR SLEEP  . Ascorbic Acid (VITAMIN C PO) Oral  . aspirin 81 mg, Oral, Daily  . BIOTIN PO Take by mouth.  . clorazepate (TRANXENE) 7.5 MG tablet Oral, As needed  . Multiple Vitamin (MULTIVITAMIN) tablet 1 tablet, Daily  . olmesartan (BENICAR) 20 MG  tablet TAKE 1 TABLET DAILY  . rosuvastatin (CRESTOR) 10 MG tablet TAKE 1 TABLET DAILY  . tretinoin (RETIN-A) 0.1 % cream 3-4 x  week  . Vitamin D, Cholecalciferol, 1000 UNITS TABS Take by mouth.    Cardiac Studies:   Exercise sestamibi stress test 04/26/2017:  1. Resting EKG demonstrates normal sinus rhythm, right bundle branch block. Stress EKG is negative for myocardial ischemia. Patient exercised for 9 minutes and achieved 10.16 mets. Stress terminated due to achieving target heart rate, 89% of MPHR. No symptoms reported. Normal blood pressure response. Normal exercise capacity.  2. Perfusion imaging study demonstrates mild perfusion abnormality at the basal anterolateral wall which does not reach statistical significance. Minimal ischemia in this region cannot be completely excluded. Normal LV systolic function at 03% without wall motion abnormality. Low risk study.  Echocardiogram [04/12/2017]:  Left ventricle cavity is normal in size. Mild concentric hypertrophy of the left ventricle. Mild decrease in LV systolic function with global hypokinesis. Visual EF is 45-50%. Calculated EF 50%. Trileaflet aortic valve with mild to moderate regurgitation.  EKG:  EKG 09/17/2023 1: Normal sinus rhythm with rate of 76 bpm, left axis deviation, left anterior fascicular block.  Right bundle asked block.  Bifascicular block.  No evidence of ischemia.   No significant change from EKG 05/08/2018, 04/10/2017.  Assessment     ICD-10-CM   1. Essential hypertension  I10 EKG 12-Lead    PCV ECHOCARDIOGRAM COMPLETE  2. Pure hypercholesterolemia  E78.00   3. Abnormal nuclear stress test  R94.39   4. Bifascicular block  I45.2   5. Moderate aortic regurgitation  I35.1 PCV ECHOCARDIOGRAM COMPLETE    Recommendations:   Eric Parks.  is a 64 y.o. Caucasian male with hypertension and mild hyperlipidemia, abnormal nuclear stress test in 2018 revealing mild anterior ischemia, echocardiogram revealing  mild to moderate aortic regurgitation with LVEF 45 to 50% in 2018 presents for annual visit. He does have back and spine issues but does exercise regularly on the treadmill at a moderate speed and also walks. He has no symptoms with this.  Blood pressure is very well controlled.  He has not had any chest pain or shortness of breath and he is on aggressive medical therapy for mild ischemia noted on the stress test.  With regard to aortic regurgitation, as it has been greater than 2 years, aortic regurgitation murmur appears slightly more pronounced, will obtain an echocardiogram.  Unless this is abnormal I will see him back in 1 year.  No antibiotics prophylaxis is indicated.  Adrian Prows, MD, [facility] 09/17/2019, 12:11 PM [facility]. [place] Office: [phone]

## 2019-09-20 LAB — VITAMIN D 1,25 DIHYDROXY
Vitamin D 1, 25 (OH)2 Total: 42 pg/mL
Vitamin D2 1, 25 (OH)2: <10 pg/mL
Vitamin D3 1, 25 (OH)2: 42 pg/mL

## 2019-09-23 ENCOUNTER — Ambulatory Visit: Payer: Medicare Other

## 2019-09-23 ENCOUNTER — Other Ambulatory Visit: Payer: Self-pay

## 2019-09-23 DIAGNOSIS — I351 Nonrheumatic aortic (valve) insufficiency: Secondary | ICD-10-CM

## 2019-09-23 DIAGNOSIS — I10 Essential (primary) hypertension: Secondary | ICD-10-CM

## 2020-01-18 ENCOUNTER — Other Ambulatory Visit: Payer: Self-pay | Admitting: Cardiology

## 2020-06-28 ENCOUNTER — Other Ambulatory Visit: Payer: Self-pay | Admitting: Cardiology

## 2020-09-01 ENCOUNTER — Other Ambulatory Visit: Payer: Self-pay

## 2020-09-01 ENCOUNTER — Telehealth: Payer: Self-pay

## 2020-09-05 ENCOUNTER — Telehealth: Payer: Self-pay | Admitting: Gastroenterology

## 2020-09-05 NOTE — Telephone Encounter (Signed)
Inbound call from patient requesting a call back please.  Based on what was found in his last colonoscopy and family history of colon cancer, he wants to know if he should have colonoscopy sooner.  Please advise.

## 2020-09-05 NOTE — Telephone Encounter (Signed)
Given his history a 5 year interval for colonoscopy in March 2025 is correct.  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  If his physicians at [facility] recommend colonoscopy at an earlier date we can revise this recommendation.

## 2020-09-05 NOTE — Telephone Encounter (Signed)
Error

## 2020-09-05 NOTE — Telephone Encounter (Signed)
Patient notified

## 2020-09-13 NOTE — Telephone Encounter (Signed)
From pt

## 2020-09-13 NOTE — Progress Notes (Signed)
Primary Physician/Referring:  Chesley Noon, MD  Patient ID: Eric Lint., male    DOB: 18-Jun-1956, 65 y.o.   MRN: [id number]  Chief complaints: Follow-up of hypertension and hyperlipidemia and abnormal stress test.  HPI:    Eric Parks.  is a 65 y.o. Caucasian male with hypertension and mild hyperlipidemia, abnormal nuclear stress test in 2018 revealing mild anterior ischemia, echocardiogram revealing mild to moderate aortic regurgitation with LVEF 45 to 50% in 2018 presents for annual visit. He does have back and spine issues but does exercise regularly on the treadmill at a moderate speed. He has no symptoms with this.  He is now on aggressive medical therapy with statins.  He presents for annual visit, states that he is doing well.  Tolerating Crestor and Benicar. Otherwise remains asymptomatic without chest pain or shortness of breath.  Past Medical History:  Diagnosis Date  . Cancer determined by colorectal biopsy ([place])   . Depression    reactive  . History of chicken pox   . Hyperlipidemia   . Hypertension   . Spinal stenosis    Past Surgical History:  Procedure Laterality Date  . BACK SURGERY     foraminotomy  . COLONOSCOPY    . HERNIA REPAIR    . TONSILLECTOMY  1977   Social History   Tobacco Use  . Smoking status: Never Smoker  . Smokeless tobacco: Never Used  Substance Use Topics  . Alcohol use: Yes    Alcohol/week: 7.0 standard drinks    Types: 7 Standard drinks or equivalent per week    Comment: wine nightly  Marital Status: Married    Family History  Problem Relation Age of Onset  . Arthritis Father   . Hyperlipidemia Father   . Heart disease Father   . Hypertension Father   . Diabetes Father   . Arthritis Mother   . Colon cancer Mother 81  . Stomach cancer Maternal Aunt        stomach cancer or ovarian  . Melanoma Maternal Grandfather   . Pancreatic cancer Neg Hx   . Rectal cancer Neg Hx   . Colon polyps Neg Hx   .  Esophageal cancer Neg Hx     ROS  Review of Systems  Cardiovascular: Negative for chest pain, dyspnea on exertion and leg swelling.  Musculoskeletal: Positive for back pain and muscle cramps.  Gastrointestinal: Negative for melena.   Objective  Blood pressure 117/75, pulse 88, temperature 97.8 F (36.6 C), temperature source Temporal, resp. rate 16, height _0  (1.727 m), weight 173 lb 3.2 oz (78.6 kg), SpO2 98 %. Body mass index is 26.33 kg/m.  Vitals with BMI 09/16/2020 09/17/2019 02/08/2019  Height _1  _2  _3   Weight 173 lbs 3 oz 170 lbs 13 oz 161 lbs  BMI 26.34 37.90 24.09  Systolic 735 329 924  Diastolic 75 79 69  Pulse 88 85 79     Physical Exam Constitutional:      General: He is not in acute distress.    Appearance: He is well-developed.  HENT:     Head: Atraumatic.  Eyes:     Conjunctiva/sclera: Conjunctivae normal.  Neck:     Thyroid: No thyromegaly.     Vascular: No JVD.  Cardiovascular:     Rate and Rhythm: Normal rate and regular rhythm.     Pulses: Normal pulses and intact distal pulses.     Heart sounds: Murmur heard.   Blowing  decrescendo early diastolic murmur is present with a grade of 2/4 at the upper right sternal border. No gallop.   Pulmonary:     Effort: Pulmonary effort is normal.     Breath sounds: Normal breath sounds.  Abdominal:     General: Bowel sounds are normal.     Palpations: Abdomen is soft.  Musculoskeletal:        General: Normal range of motion.     Cervical back: Neck supple.  Skin:    General: Skin is warm and dry.  Neurological:     Mental Status: He is alert.    Radiology: No results found.  Laboratory examination:   CMP Latest Ref Rng & Units 09/10/2019 09/18/2017 07/15/2012  Glucose 65 - 99 mg/dL 92 102(H) 101(H)  BUN 8 - 27 mg/dL _0 Creatinine 0.76 - 1.27 mg/dL 0.79 0.67 0.7  Sodium 134 - 144 mmol/L 142 141 138  Potassium 3.5 - 5.2 mmol/L 4.6 4.3 4.4  Chloride 96 - 106 mmol/L 105 107 105  CO2 20 - 29  mmol/L _1 Calcium 8.6 - 10.2 mg/dL 9.7 9.4 9.3  Total Protein 6.0 - 8.5 g/dL 7.1 - 6.9  Total Bilirubin 0.0 - 1.2 mg/dL 0.8 - 1.1  Alkaline Phos 39 - 117 IU/L 65 - 59  AST 0 - 40 IU/L 24 - 14  ALT 0 - 44 IU/L 18 - 15   CBC Latest Ref Rng & Units 09/18/2017 07/15/2012 01/26/2011  WBC 4.0 - 10.5 K/uL 6.0 5.1 5.3  Hemoglobin 13.0 - 17.0 g/dL 14.4 14.2 15.0  Hematocrit 39.0 - 52.0 % 42.2 42.0 44.2  Platelets 150 - 400 K/uL 144(L) 156.0 152.0   Lipid Panel     Component Value Date/Time   CHOL 140 09/10/2019 1038   TRIG 77 09/10/2019 1038   HDL 68 09/10/2019 1038   CHOLHDL 4 07/15/2012 1053   VLDL 31.0 07/15/2012 1053   LDLCALC 57 09/10/2019 1038   LDLDIRECT 51 09/10/2019 1037    External labs:    Labs 09/12/2020:  Serum glucose 84 mg, BUN 14, creatinine 0.80, EGFR 98 mL, potassium 4.3, CMP normal.  Total cholesterol 132, triglycerides 113, HDL 58, LDL 54.  LPA <8.4.  Vitamin D normal at 43.7.  Uric acid normal at 5.1. Medications   Current Outpatient Medications on File Prior to Visit  Medication Sig Dispense Refill  . ALPRAZolam (XANAX) 1 MG tablet TAKE 1 TABLET BY MOUTH AT BEDTIME AS NEEDED FOR SLEEP    . Ascorbic Acid (VITAMIN C PO) Take by mouth.    Marland Kitchen BIOTIN PO Take by mouth.    . clorazepate (TRANXENE) 7.5 MG tablet Take by mouth as needed.    . Multiple Vitamin (MULTIVITAMIN) tablet Take 1 tablet by mouth daily.    Marland Kitchen olmesartan (BENICAR) 20 MG tablet TAKE 1 TABLET DAILY 90 tablet 3  . rosuvastatin (CRESTOR) 10 MG tablet TAKE 1 TABLET DAILY 90 tablet 3  . tretinoin (RETIN-A) 0.1 % cream 3-4 x week    . Vitamin D, Cholecalciferol, 1000 UNITS TABS Take by mouth.    . Coenzyme Q10 10 MG capsule Take 1 capsule by mouth daily.    . Lutein 20 MG CAPS Take 1 tablet by mouth daily.     No current facility-administered medications on file prior to visit.    Cardiac Studies:   Exercise sestamibi stress test 04/26/2017:  1. Resting EKG demonstrates normal sinus  rhythm, right bundle branch block. Stress  EKG is negative for myocardial ischemia. Patient exercised for 9 minutes and achieved 10.16 mets. Stress terminated due to achieving target heart rate, 89% of MPHR. No symptoms reported. Normal blood pressure response. Normal exercise capacity.  2. Perfusion imaging study demonstrates mild perfusion abnormality at the basal anterolateral wall which does not reach statistical significance. Minimal ischemia in this region cannot be completely excluded. Normal LV systolic function at 71% without wall motion abnormality. Low risk study.  Echocardiogram 09/23/2019: Left ventricle cavity is normal in size and wall thickness. Normal global wall motion. Normal LV systolic function with EF 56%. Doppler evidence of grade I (impaired) diastolic dysfunction, normal LAP. Trileaflet aortic valve.  Moderate (Grade II) aortic regurgitation. Inadequate TR jet to estimate pulmonary artery systolic pressure. Estimated RA pressure 8 mmHg.   EKG:    EKG 09/16/2020: Normal sinus rhythm at rate of 73 bpm, left axis deviation, left anterior fascicular block. Right bundle branch block.  No evidence of ischemia. No significant change from EKG 09/17/2019.   Assessment     ICD-10-CM   1. Essential hypertension  I10 EKG 12-Lead  2. Pure hypercholesterolemia  E78.00   3. Moderate aortic regurgitation  I35.1 PCV ECHOCARDIOGRAM COMPLETE  4. Abnormal nuclear stress test  R94.39     Recommendations:   Eric Parks.  is a 65 y.o. Caucasian male with hypertension and mild hyperlipidemia, abnormal nuclear stress test in 2018 revealing mild anterior ischemia, echocardiogram revealing mild to moderate aortic regurgitation with LVEF 45 to 50% in 2018 presents for annual visit. He does have back and spine issues but does exercise regularly on the treadmill at a moderate speed and also walks. He has no symptoms with this.  Blood pressure is very well controlled.  He has not had any  chest pain or shortness of breath and he is on aggressive medical therapy for mild ischemia noted on the stress test.  With regard to aortic regurgitation, no change in physical exam, echocardiogram done last year revealed very stable moderate aortic regurgitation.  I will repeat echocardiogram next year prior to this office visit.  His lipids were reviewed.  Excellent control of lipids.  In view of family history of premature coronary disease in his father who had coronary disease and had MI in his 53s, hypertension and hyperlipidemia, aggressive risk factor modification indicated and he has been following very strict diet and all the risk factors are well controlled.  We discussed further testing including coronary calcium scoring.  Advised him that as his risk factors are all very well taken care of it would not make any difference in regard to his therapy.  Stable from cardiac standpoint, I will see him back in a year.  This was a 40-minute encounter with regard to discussions of external labs, discussions regarding aortic regurgitation and indication for aortic valve replacement and cardiac risk factor modification.    Adrian Prows, MD, [facility] 09/16/2020, 12:10 PM Office: [phone] Pager: [phone]

## 2020-09-16 ENCOUNTER — Ambulatory Visit: Payer: Medicare Other | Admitting: Cardiology

## 2020-09-16 ENCOUNTER — Encounter: Payer: Self-pay | Admitting: Cardiology

## 2020-09-16 ENCOUNTER — Other Ambulatory Visit: Payer: Self-pay

## 2020-09-16 VITALS — BP 117/75 | HR 88 | Temp 97.8°F | Resp 16 | Ht 68.0 in | Wt 173.2 lb

## 2020-09-16 DIAGNOSIS — E7800 Pure hypercholesterolemia, unspecified: Secondary | ICD-10-CM

## 2020-09-16 DIAGNOSIS — I351 Nonrheumatic aortic (valve) insufficiency: Secondary | ICD-10-CM

## 2020-09-16 DIAGNOSIS — R9439 Abnormal result of other cardiovascular function study: Secondary | ICD-10-CM

## 2020-09-16 DIAGNOSIS — I10 Essential (primary) hypertension: Secondary | ICD-10-CM

## 2020-09-19 ENCOUNTER — Ambulatory Visit: Payer: Medicare Other | Admitting: Cardiology

## 2020-12-28 ENCOUNTER — Other Ambulatory Visit: Payer: Self-pay | Admitting: Cardiology

## 2021-01-17 ENCOUNTER — Other Ambulatory Visit: Payer: Self-pay | Admitting: Neurosurgery

## 2021-01-17 DIAGNOSIS — G8929 Other chronic pain: Secondary | ICD-10-CM

## 2021-01-17 DIAGNOSIS — M5442 Lumbago with sciatica, left side: Secondary | ICD-10-CM

## 2021-01-28 ENCOUNTER — Ambulatory Visit
Admission: RE | Admit: 2021-01-28 | Discharge: 2021-01-28 | Disposition: A | Discharge status: Home / Self Care | Payer: Medicare Other | Source: Ambulatory Visit | Attending: Neurosurgery | Admitting: Neurosurgery

## 2021-01-28 ENCOUNTER — Other Ambulatory Visit: Payer: Self-pay

## 2021-01-28 DIAGNOSIS — G8929 Other chronic pain: Secondary | ICD-10-CM

## 2021-01-28 DIAGNOSIS — M5442 Lumbago with sciatica, left side: Secondary | ICD-10-CM

## 2021-01-28 MED ORDER — GADOBENATE DIMEGLUMINE 529 MG/ML IV SOLN
15.0000 mL | Freq: Once | INTRAVENOUS | Status: AC | PRN
  Administered 2021-01-28: 15 mL via INTRAVENOUS

## 2021-02-20 NOTE — Telephone Encounter (Signed)
From pt

## 2021-06-26 ENCOUNTER — Other Ambulatory Visit: Payer: Self-pay | Admitting: Cardiology

## 2021-07-07 ENCOUNTER — Encounter: Payer: Self-pay | Admitting: Cardiology

## 2021-07-07 NOTE — Telephone Encounter (Signed)
From patient.

## 2021-07-14 ENCOUNTER — Encounter: Payer: Self-pay | Admitting: Cardiology

## 2021-07-14 NOTE — Telephone Encounter (Signed)
From patient.

## 2021-08-23 ENCOUNTER — Encounter: Payer: Self-pay | Admitting: Cardiology

## 2021-08-23 DIAGNOSIS — I10 Essential (primary) hypertension: Secondary | ICD-10-CM

## 2021-08-23 DIAGNOSIS — E7800 Pure hypercholesterolemia, unspecified: Secondary | ICD-10-CM

## 2021-08-23 DIAGNOSIS — E559 Vitamin D deficiency, unspecified: Secondary | ICD-10-CM

## 2021-08-23 NOTE — Telephone Encounter (Signed)
Can you order labs and I will send them to his PCP.

## 2021-08-23 NOTE — Telephone Encounter (Signed)
ICD-10-CM   1. Essential hypertension  I10 CBC    CMP14+EGFR    CANCELED: CMP14+EGFR    2. Pure hypercholesterolemia  E78.00 Lipid Panel With LDL/HDL Ratio    CANCELED: Lipid Panel With LDL/HDL Ratio    3. Hypovitaminosis D  E55.9 VITAMIN D 25 Hydroxy (Vit-D Deficiency, Fractures)      Orders Placed This Encounter  Procedures   CBC   VITAMIN D 25 Hydroxy (Vit-D Deficiency, Fractures)   CMP14+EGFR   Lipid Panel With LDL/HDL Ratio

## 2021-08-24 ENCOUNTER — Ambulatory Visit: Payer: Medicare Other

## 2021-08-24 ENCOUNTER — Other Ambulatory Visit: Payer: Self-pay

## 2021-08-24 DIAGNOSIS — I351 Nonrheumatic aortic (valve) insufficiency: Secondary | ICD-10-CM

## 2021-09-12 ENCOUNTER — Encounter: Payer: Self-pay | Admitting: Cardiology

## 2021-09-13 NOTE — Telephone Encounter (Signed)
From pt

## 2021-09-21 ENCOUNTER — Encounter: Payer: Self-pay | Admitting: Gastroenterology

## 2021-09-21 ENCOUNTER — Encounter: Payer: Self-pay | Admitting: Cardiology

## 2021-09-21 ENCOUNTER — Other Ambulatory Visit: Payer: Self-pay

## 2021-09-21 ENCOUNTER — Ambulatory Visit: Payer: Medicare Other | Admitting: Cardiology

## 2021-09-21 ENCOUNTER — Ambulatory Visit (INDEPENDENT_AMBULATORY_CARE_PROVIDER_SITE_OTHER): Payer: Medicare Other | Admitting: Gastroenterology

## 2021-09-21 VITALS — BP 119/76 | HR 78 | Temp 97.5°F | Resp 16 | Ht 68.0 in | Wt 175.0 lb

## 2021-09-21 VITALS — BP 120/70 | HR 70 | Ht 68.0 in | Wt 173.0 lb

## 2021-09-21 DIAGNOSIS — E7800 Pure hypercholesterolemia, unspecified: Secondary | ICD-10-CM

## 2021-09-21 DIAGNOSIS — K921 Melena: Secondary | ICD-10-CM

## 2021-09-21 DIAGNOSIS — Z800 Family history of malignant neoplasm of digestive organs: Secondary | ICD-10-CM

## 2021-09-21 DIAGNOSIS — I351 Nonrheumatic aortic (valve) insufficiency: Secondary | ICD-10-CM

## 2021-09-21 DIAGNOSIS — I10 Essential (primary) hypertension: Secondary | ICD-10-CM

## 2021-09-21 MED ORDER — NA SULFATE-K SULFATE-MG SULF 17.5-3.13-1.6 GM/177ML PO SOLN: 1.0000 | Freq: Once | ORAL | 0 refills | Status: AC

## 2021-09-21 NOTE — Progress Notes (Signed)
? ? ?History of Present Illness: This is a 66 year old male referred by Chesley Noon, MD for the evaluation of small-volume rectal bleeding, family history of colon cancer in his mother.  He has a history of internal hemorrhoids and intermittently notes small amounts of bright red blood on the tissue after bowel movements.  On a couple of occasions recently he noted darker red stool mixed in with the stool.  He has been followed by Dr. 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  Following treatment it has not recurred.  Most recent anoscopy by Dr. Sheryn Bison was in October 2022 which was negative. Denies weight loss, abdominal pain, constipation, diarrhea, change in stool caliber, melena, nausea, vomiting, dysphagia, reflux symptoms, chest pain. ? ?Colonoscopy 08/2018 ?- One 5 mm polyp in the rectum, removed with a cold biopsy forceps. Resected and retrieved. Path: high grade squamous intraepithelial lesion  ?- Internal hemorrhoids. Mild bleeding following polyp biopsy. Injected as described. ?- The examination was otherwise normal on direct and retroflexion views. ? ? ?No Known Allergies ?Outpatient Medications Prior to Visit  ?Medication Sig Dispense Refill  ? ALPRAZolam (XANAX) 1 MG tablet TAKE 1 TABLET BY MOUTH AT BEDTIME AS NEEDED FOR SLEEP    ? APPLE CIDER VINEGAR PO Take 1 capsule by mouth daily at 6 (six) AM.    ? Ascorbic Acid (VITAMIN C PO) Take by mouth.    ? BIOTIN PO Take by mouth.    ? CHONDROITIN SULFATE A PO Take 1,200 mg by mouth daily at 6 (six) AM.    ? clorazepate (TRANXENE) 7.5 MG tablet Take by mouth as needed.    ? Coenzyme Q10 10 MG capsule Take 1 capsule by mouth daily.    ? Glucosamine-Chondroit-Vit C-Mn (GLUCOSAMINE 1500 COMPLEX PO) Take by mouth.    ? Lutein 20 MG CAPS Take 1 tablet by mouth daily.    ? minoxidil (LONITEN) 2.5 MG tablet Take 2.5 mg by mouth daily.    ? Multiple Vitamin (MULTIVITAMIN) tablet Take 1 tablet by mouth daily.    ? olmesartan  (BENICAR) 20 MG tablet TAKE 1 TABLET DAILY 90 tablet 3  ? rosuvastatin (CRESTOR) 10 MG tablet TAKE 1 TABLET DAILY 90 tablet 3  ? tretinoin (RETIN-A) 0.1 % cream 3-4 x week    ? Vitamin D, Cholecalciferol, 1000 UNITS TABS Take by mouth.    ? zinc gluconate 50 MG tablet Take 22 mg by mouth daily.    ? ?No facility-administered medications prior to visit.  ? ?Past Medical History:  ?Diagnosis Date  ? AIN grade II   ? Anxiety   ? Depression   ? reactive  ? History of chicken pox   ? Hyperlipidemia   ? Hypertension   ? PSA elevation   ? Spinal stenosis   ? ?Past Surgical History:  ?Procedure Laterality Date  ? BACK SURGERY    ? foraminotomy  ? COLONOSCOPY    ? HERNIA REPAIR    ? TONSILLECTOMY  1977  ? ?Social History  ? ?Socioeconomic History  ? Marital status: Married  ?  Spouse name: Not on file  ? Number of children: 0  ? Years of education: Not on file  ? Highest education level: Not on file  ?Occupational History  ? Occupation: Probation officer  ?Tobacco Use  ? Smoking status: Never  ? Smokeless tobacco: Never  ?Vaping Use  ? Vaping Use: Never used  ?Substance and Sexual Activity  ? Alcohol use:  Yes  ?  Alcohol/week: 7.0 standard drinks  ?  Types: 7 Standard drinks or equivalent per week  ?  Comment: wine nightly  ? Drug use: Never  ? Sexual activity: Yes  ?Other Topics Concern  ? Not on file  ?Social History Narrative  ? Not on file  ? ?Social Determinants of Health  ? ?Financial Resource Strain: Not on file  ?Food Insecurity: Not on file  ?Transportation Needs: Not on file  ?Physical Activity: Not on file  ?Stress: Not on file  ?Social Connections: Not on file  ? ?Family History  ?Problem Relation Age of Onset  ? Arthritis Father   ? Hyperlipidemia Father   ? Heart disease Father   ? Hypertension Father   ? Diabetes Father   ? Arthritis Mother   ? Colon cancer Mother 26  ? Stomach cancer Maternal Aunt   ?     stomach cancer or ovarian  ? Melanoma Maternal Grandfather   ? Pancreatic cancer Neg Hx   ? Rectal cancer Neg Hx   ?  Colon polyps Neg Hx   ? Esophageal cancer Neg Hx   ? ?   ? ?Review of Systems: Pertinent positive and negative review of systems were noted in the above HPI section. All other review of systems were otherwise negative. ? ? ?Physical Exam: ?General: Well developed, well nourished, no acute distress ?Head: Normocephalic and atraumatic ?Eyes: Sclerae anicteric, EOMI ?Ears: Normal auditory acuity ?Mouth: Not examined, mask on during Covid-19 pandemic ?Neck: Supple, no masses or thyromegaly ?Lungs: Clear throughout to auscultation ?Heart: Regular rate and rhythm; no murmurs, rubs or bruits ?Abdomen: Soft, non tender and non distended. No masses, hepatosplenomegaly or hernias noted. Normal Bowel sounds ?Rectal: Deferred to colonoscopy ?Musculoskeletal: Symmetrical with no gross deformities  ?Skin: No lesions on visible extremities ?Pulses:  Normal pulses noted ?Extremities: No clubbing, cyanosis, edema or deformities noted ?Neurological: Alert oriented x 4, grossly nonfocal ?Cervical Nodes:  No significant cervical adenopathy ?Inguinal Nodes: No significant inguinal adenopathy ?Psychological:  Alert and cooperative. Normal mood and affect ? ? ?Assessment and Recommendations: ? ?Small-volume hematochezia with a change in the pattern of bleeding from bright red blood on the tissue paper to small volume of darker blood mixed in the stool. Family history of colon cancer, first-degree relative (mother).  His last colonoscopy was 3 years ago.  He is reassured that this is likely hemorrhoidal bleeding however it is reasonable to further exclude colorectal neoplasms and other disorders with colonoscopy.  Schedule colonoscopy. The risks (including bleeding, perforation, infection, missed lesions, medication reactions and possible hospitalization or surgery if complications occur), benefits, and alternatives to colonoscopy with possible biopsy and possible polypectomy were discussed with the patient and they consent to proceed.    ?High grade squamous intraepithelial lesion AIN-II, followed by Dr. Sheryn Bison at [facility]. Anoscopy by Dr. Sheryn Bison in Oct. 2022 showed no lesions.  Follow-up with Dr. Sheryn Bison is recommended. ? ? ?cc: Chesley Noon, MD ?[address] ?[place],  [place] [address] ?

## 2021-09-21 NOTE — Progress Notes (Signed)
? ?Primary Physician/Referring:  Chesley Noon, MD ? ?Patient ID: Eric Lint., male    DOB: 09/28/1955, 66 y.o.   MRN: [id number] ? ?Chief complaints: Follow-up of hypertension and hyperlipidemia and abnormal stress test. ? ?HPI:   ? ?Eric Arroyave.  is a 66 y.o. Caucasian male with hypertension and mild hyperlipidemia, abnormal nuclear stress test in 2018 revealing mild anterior ischemia, echocardiogram revealing mild to moderate aortic regurgitation with LVEF 45 to 50% in 2018 66 presents for annual visit.   ? ?Tolerating Crestor and Benicar. Otherwise remains asymptomatic without chest pain or shortness of breath. ? ?Past Medical History:  ?Diagnosis Date  ? AIN grade II   ? Anxiety   ? Depression   ? reactive  ? History of chicken pox   ? Hyperlipidemia   ? Hypertension   ? PSA elevation   ? Spinal stenosis   ? ?Past Surgical History:  ?Procedure Laterality Date  ? BACK SURGERY    ? foraminotomy  ? COLONOSCOPY    ? HERNIA REPAIR    ? TONSILLECTOMY  1977  ? ?Social History  ? ?Tobacco Use  ? Smoking status: Never  ? Smokeless tobacco: Never  ?Substance Use Topics  ? Alcohol use: Yes  ?  Alcohol/week: 7.0 standard drinks  ?  Types: 7 Standard drinks or equivalent per week  ?  Comment: wine nightly  ?Marital Status: Married  ?  ?Family History  ?Problem Relation Age of Onset  ? Arthritis Father   ? Hyperlipidemia Father   ? Heart disease Father   ? Hypertension Father   ? Diabetes Father   ? Arthritis Mother   ? Colon cancer Mother 69  ? Stomach cancer Maternal Aunt   ?     stomach cancer or ovarian  ? Melanoma Maternal Grandfather   ? Pancreatic cancer Neg Hx   ? Rectal cancer Neg Hx   ? Colon polyps Neg Hx   ? Esophageal cancer Neg Hx   ?  ?ROS  ?Review of Systems  ?Cardiovascular:  Negative for chest pain, dyspnea on exertion and leg swelling.  ?Musculoskeletal:  Positive for back pain and muscle cramps.  ?Gastrointestinal:  Negative for melena.  ?Objective  ?Blood pressure 119/76, pulse  78, temperature (!) 97.5 ?F (36.4 ?C), temperature source Temporal, resp. rate 16, height _0  (1.727 m), weight 175 lb (79.4 kg), SpO2 98 %. Body mass index is 26.61 kg/m?.  ? ?  09/21/2021  ? 10:59 AM 09/16/2020  ? 11:30 AM 09/17/2019  ? 11:28 AM  ?Vitals with BMI  ?Height _1  _2  _3   ?Weight 175 lbs 173 lbs 3 oz 170 lbs 13 oz  ?BMI 26.61 26.34 25.98  ?Systolic 409 811 914  ?Diastolic 76 75 79  ?Pulse 78 88 85  ?  ? ?Physical Exam ?Constitutional:   ?   Appearance: He is well-developed.  ?Eyes:  ?   Conjunctiva/sclera: Conjunctivae normal.  ?Neck:  ?   Vascular: No JVD.  ?Cardiovascular:  ?   Rate and Rhythm: Normal rate and regular rhythm.  ?   Pulses: Normal pulses and intact distal pulses.  ?   Heart sounds: Murmur heard.  ?Blowing decrescendo early diastolic murmur is present with a grade of 2/4 at the upper right sternal border.  ?  No gallop.  ?Pulmonary:  ?   Effort: Pulmonary effort is normal.  ?   Breath sounds: Normal breath sounds.  ?Abdominal:  ?   General:  Bowel sounds are normal.  ?   Palpations: Abdomen is soft.  ?Musculoskeletal:     ?   General: Normal range of motion.  ?   Right lower leg: No edema.  ?   Left lower leg: No edema.  ? ?Radiology: ?No results found. ? ?Laboratory examination:  ? ?External labs: ? ?Labs 09/13/2021: ? ?Hb 13.8/HCT 41.6, platelets 173, normal indicis. ? ?Vitamin D54.5. ? ?Total cholesterol 07[birth year]1932, triglycerides 56, HDL 69, LDL 53. ? ?Serum glucose 92 mg, BUN 17, creatinine 0.85, EGFR 95 mill.  Potassium 4.5.  Uric acid 5.8. ?Medications  ? ? ?Current Outpatient Medications:  ?  ALPRAZolam (XANAX) 1 MG tablet, TAKE 1 TABLET BY MOUTH AT BEDTIME AS NEEDED FOR SLEEP, Disp: , Rfl:  ?  APPLE CIDER VINEGAR PO, Take 1 capsule by mouth daily at 6 (six) AM., Disp: , Rfl:  ?  Ascorbic Acid (VITAMIN C PO), Take by mouth., Disp: , Rfl:  ?  BIOTIN PO, Take by mouth., Disp: , Rfl:  ?  CHONDROITIN SULFATE A PO, Take 1,200 mg by mouth daily at 6 (six) AM., Disp: , Rfl:  ?   clorazepate (TRANXENE) 7.5 MG tablet, Take by mouth as needed., Disp: , Rfl:  ?  Coenzyme Q10 10 MG capsule, Take 1 capsule by mouth daily., Disp: , Rfl:  ?  Glucosamine-Chondroit-Vit C-Mn (GLUCOSAMINE 1500 COMPLEX PO), Take by mouth., Disp: , Rfl:  ?  Lutein 20 MG CAPS, Take 1 tablet by mouth daily., Disp: , Rfl:  ?  minoxidil (LONITEN) 2.5 MG tablet, Take 2.5 mg by mouth daily., Disp: , Rfl:  ?  Multiple Vitamin (MULTIVITAMIN) tablet, Take 1 tablet by mouth daily., Disp: , Rfl:  ?  olmesartan (BENICAR) 20 MG tablet, TAKE 1 TABLET DAILY, Disp: 90 tablet, Rfl: 3 ?  rosuvastatin (CRESTOR) 10 MG tablet, TAKE 1 TABLET DAILY, Disp: 90 tablet, Rfl: 3 ?  tretinoin (RETIN-A) 0.1 % cream, 3-4 x week, Disp: , Rfl:  ?  Vitamin D, Cholecalciferol, 1000 UNITS TABS, Take by mouth., Disp: , Rfl:  ?  zinc gluconate 50 MG tablet, Take 22 mg by mouth daily., Disp: , Rfl:   ?  ?Cardiac Studies:  ? ?Exercise sestamibi stress test 04/26/2017:  ?1. Resting EKG demonstrates normal sinus rhythm, right bundle branch block. Stress EKG is negative for myocardial ischemia. Patient exercised for 9 minutes and achieved 10.16 mets. Stress terminated due to achieving target heart rate, 89% of MPHR. No symptoms reported. Normal blood pressure response. Normal exercise capacity.  ?2. Perfusion imaging study demonstrates mild perfusion abnormality at the basal anterolateral wall which does not reach statistical significance. Minimal ischemia in this region cannot be completely excluded. Normal LV systolic function at 78% without wall motion abnormality. Low risk study. ? ?PCV ECHOCARDIOGRAM COMPLETE 08/24/2021  ?Normal LV systolic function with visual EF 60-65%. Left ventricle cavity is normal in size. Normal left ventricular wall thickness. Normal global wall motion. Normal diastolic filling pattern, normal LAP. ?Mild (Grade I) aortic regurgitation. ?IVC is dilated with a respiratory response of >50%. ?Compared to study 09/23/2019 Moderate AR is now  mild otherwise no significant change.  ? ? ?EKG:  ? ?EKG 320 09/19/2011: Normal sinus rhythm at rate of 71 bpm, left axis deviation, left anterior fascicular block.  Incomplete right bundle branch block.  No significant change from 09/16/2020.  ? ?Assessment  ? ?  ICD-10-CM   ?1. Essential hypertension  I10   ?  ?2. Moderate aortic regurgitation  I35.1 EKG  12-Lead  ?  ?3. Pure hypercholesterolemia  E78.00   ?  ?  ?Recommendations:  ? ?Eric Rahming.  is a 66 y.o. Caucasian male with hypertension and mild hyperlipidemia, abnormal nuclear stress test in 2018 revealing mild anterior ischemia, echocardiogram revealing mild to moderate aortic regurgitation with LVEF 45 to 50% in 2018 66 presents for annual visit.  He remains asymptomatic, continues to exercise regularly both on the treadmill and by walking. ? ?Blood pressure is very well controlled.  He has not had any chest pain or shortness of breath and he is on aggressive medical therapy for mild ischemia noted on the stress test. ? ?With regard to aortic regurgitation, no change in physical exam, echocardiogram done  revealed very stable aortic regurgitation, improved from moderate to mild.  He has normal LVEF. ? ?His lipids were reviewed.  Excellent control of lipids. ? ?In view of family history of premature coronary disease in his father who had coronary disease and had MI in his 68s, hypertension and hyperlipidemia, aggressive risk factor modification indicated and he has been following very strict diet and all the risk factors are well controlled.  I will see him back on an annual basis.  ? ? ? ?Eric Prows, MD, [facility] ?09/21/2021, 11:53 AM ?Office: [phone] ?Pager: [phone]  ?

## 2021-09-21 NOTE — Patient Instructions (Signed)
You have been scheduled for a colonoscopy. Please follow written instructions given to you at your visit today.  Please pick up your prep supplies at the pharmacy within the next 1-3 days. If you use inhalers (even only as needed), please bring them with you on the day of your procedure.  The [facility] providers would like to encourage you to use MYCHART to communicate with providers for non-urgent requests or questions.  Due to long hold times on the telephone, sending your provider a message by MYCHART may be a faster and more efficient way to get a response.  Please allow 48 business hours for a response.  Please remember that this is for non-urgent requests.   Due to recent changes in healthcare laws, you may see the results of your imaging and laboratory studies on MyChart before your provider has had a chance to review them.  We understand that in some cases there may be results that are confusing or concerning to you. Not all laboratory results come back in the same time frame and the provider may be waiting for multiple results in order to interpret others.  Please give us 48 hours in order for your provider to thoroughly review all the results before contacting the office for clarification of your results.   Thank you for choosing me and [facility].  Malcolm T. Stark, Jr., MD., FACG  

## 2021-10-25 ENCOUNTER — Encounter: Payer: Self-pay | Admitting: Certified Registered Nurse Anesthetist

## 2021-10-26 ENCOUNTER — Encounter: Payer: Self-pay | Admitting: Gastroenterology

## 2021-11-01 ENCOUNTER — Ambulatory Visit (AMBULATORY_SURGERY_CENTER): Payer: Medicare Other | Admitting: Gastroenterology

## 2021-11-01 ENCOUNTER — Encounter: Payer: Self-pay | Admitting: Gastroenterology

## 2021-11-01 VITALS — BP 93/60 | HR 66 | Temp 97.8°F | Resp 9 | Ht 68.0 in | Wt 173.0 lb

## 2021-11-01 DIAGNOSIS — K5730 Diverticulosis of large intestine without perforation or abscess without bleeding: Secondary | ICD-10-CM

## 2021-11-01 DIAGNOSIS — K640 First degree hemorrhoids: Secondary | ICD-10-CM

## 2021-11-01 DIAGNOSIS — K921 Melena: Secondary | ICD-10-CM | POA: Diagnosis not present

## 2021-11-01 DIAGNOSIS — K621 Rectal polyp: Secondary | ICD-10-CM

## 2021-11-01 DIAGNOSIS — Z800 Family history of malignant neoplasm of digestive organs: Secondary | ICD-10-CM | POA: Diagnosis present

## 2021-11-01 MED ORDER — SODIUM CHLORIDE 0.9 % IV SOLN: 500.0000 mL | Freq: Once | INTRAVENOUS | Status: DC

## 2021-11-01 NOTE — Progress Notes (Signed)
? ?History & Physical ? ?Primary Care Physician:  Chesley Noon, MD ?Primary Gastroenterologist: Lucio Edward, MD ? ?CHIEF COMPLAINT: Hematochezia, family history of colon cancer ? ?HPI: Eric Parks. is a 66 y.o. 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 ? ? ?Past Medical History:  ?Diagnosis Date  ? AIN grade II   ? Anxiety   ? Depression   ? reactive  ? History of chicken pox   ? Hyperlipidemia   ? Hypertension   ? PSA elevation   ? Spinal stenosis   ? ? ?Past Surgical History:  ?Procedure Laterality Date  ? BACK SURGERY    ? foraminotomy  ? COLONOSCOPY    ? HERNIA REPAIR    ? TONSILLECTOMY  1977  ? ? ?Prior to Admission medications   ?Medication Sig Start Date End Date Taking? Authorizing Provider  ?ALPRAZolam (XANAX) 1 MG tablet TAKE 1 TABLET BY MOUTH AT BEDTIME AS NEEDED FOR SLEEP 11/18/18  Yes [provider]  ?APPLE CIDER VINEGAR PO Take 1 capsule by mouth daily at 6 (six) AM.   Yes [provider]  ?Ascorbic Acid (VITAMIN C PO) Take by mouth.   Yes [provider]  ?BIOTIN PO Take by mouth.   Yes [provider]  ?CHONDROITIN SULFATE A PO Take 1,200 mg by mouth daily at 6 (six) AM.   Yes [provider]  ?Coenzyme Q10 10 MG capsule Take 1 capsule by mouth daily.   Yes [provider]  ?Glucosamine-Chondroit-Vit C-Mn (GLUCOSAMINE 1500 COMPLEX PO) Take by mouth.   Yes [provider]  ?Lutein 20 MG CAPS Take 1 tablet by mouth daily.   Yes [provider]  ?minoxidil (LONITEN) 2.5 MG tablet Take 2.5 mg by mouth daily. 09/09/21  Yes [provider]  ?Multiple Vitamin (MULTIVITAMIN) tablet Take 1 tablet by mouth daily.   Yes [provider]  ?olmesartan (BENICAR) 20 MG tablet TAKE 1 TABLET DAILY 12/28/20  Yes Adrian Prows, MD  ?rosuvastatin (CRESTOR) 10 MG tablet TAKE 1 TABLET DAILY  06/27/21  Yes Adrian Prows, MD  ?Vitamin D, Cholecalciferol, 1000 UNITS TABS Take by mouth.   Yes [provider]  ?zinc gluconate 50 MG tablet Take 22 mg by mouth daily.   Yes [provider]  ?clorazepate (TRANXENE) 7.5 MG tablet Take by mouth as needed. ?Patient not taking: Reported on 11/01/2021 10/07/14   [provider]  ?tretinoin (RETIN-A) 0.1 % cream 3-4 x week ?Patient not taking: Reported on 11/01/2021 12/18/18   [provider]  ? ? ?Current Outpatient Medications  ?Medication Sig Dispense Refill  ? ALPRAZolam (XANAX) 1 MG tablet TAKE 1 TABLET BY MOUTH AT BEDTIME AS NEEDED FOR SLEEP    ? APPLE CIDER VINEGAR PO Take 1 capsule by mouth daily at 6 (six) AM.    ? Ascorbic Acid (VITAMIN C PO) Take by mouth.    ? BIOTIN PO Take by mouth.    ? CHONDROITIN SULFATE A PO Take 1,200 mg by mouth daily at 6 (six) AM.    ? Coenzyme Q10 10 MG capsule Take 1 capsule by mouth daily.    ? Glucosamine-Chondroit-Vit C-Mn (GLUCOSAMINE 1500 COMPLEX PO) Take by mouth.    ? Lutein 20 MG CAPS Take 1 tablet by mouth daily.    ? minoxidil (LONITEN) 2.5 MG tablet Take 2.5 mg by mouth daily.    ? Multiple Vitamin (MULTIVITAMIN)  tablet Take 1 tablet by mouth daily.    ? olmesartan (BENICAR) 20 MG tablet TAKE 1 TABLET DAILY 90 tablet 3  ? rosuvastatin (CRESTOR) 10 MG tablet TAKE 1 TABLET DAILY 90 tablet 3  ? Vitamin D, Cholecalciferol, 1000 UNITS TABS Take by mouth.    ? zinc gluconate 50 MG tablet Take 22 mg by mouth daily.    ? clorazepate (TRANXENE) 7.5 MG tablet Take by mouth as needed. (Patient not taking: Reported on 11/01/2021)    ? tretinoin (RETIN-A) 0.1 % cream 3-4 x week (Patient not taking: Reported on 11/01/2021)    ? ?Current Facility-Administered Medications  ?Medication Dose Route Frequency Provider Last Rate Last Admin  ? 0.9 %  sodium chloride infusion  500 mL Intravenous Once Ladene Artist, MD      ? ? ?Allergies as of 11/01/2021  ? (No Known Allergies)  ? ? ?Family History  ?Problem  Relation Age of Onset  ? Arthritis Father   ? Hyperlipidemia Father   ? Heart disease Father   ? Hypertension Father   ? Diabetes Father   ? Arthritis Mother   ? Colon cancer Mother 60  ? Stomach cancer Maternal Aunt   ?     stomach cancer or ovarian  ? Melanoma Maternal Grandfather   ? Pancreatic cancer Neg Hx   ? Rectal cancer Neg Hx   ? Colon polyps Neg Hx   ? Esophageal cancer Neg Hx   ? ? ?Social History  ? ?Socioeconomic History  ? Marital status: Married  ?  Spouse name: Not on file  ? Number of children: 0  ? Years of education: Not on file  ? Highest education level: Not on file  ?Occupational History  ? Occupation: Probation officer  ?Tobacco Use  ? Smoking status: Never  ? Smokeless tobacco: Never  ?Vaping Use  ? Vaping Use: Never used  ?Substance and Sexual Activity  ? Alcohol use: Yes  ?  Alcohol/week: 7.0 standard drinks  ?  Types: 7 Standard drinks or equivalent per week  ?  Comment: wine nightly  ? Drug use: Never  ? Sexual activity: Yes  ?Other Topics Concern  ? Not on file  ?Social History Narrative  ? Not on file  ? ?Social Determinants of Health  ? ?Financial Resource Strain: Not on file  ?Food Insecurity: Not on file  ?Transportation Needs: Not on file  ?Physical Activity: Not on file  ?Stress: Not on file  ?Social Connections: Not on file  ?Intimate Partner Violence: Not on file  ? ? ?Review of Systems: ? ?All systems reviewed an negative except where noted in HPI. ? ?Gen: Denies any fever, chills, sweats, anorexia, fatigue, weakness, malaise, weight loss, and sleep disorder ?CV: Denies chest pain, angina, palpitations, syncope, orthopnea, PND, peripheral edema, and claudication. ?Resp: Denies dyspnea at rest, dyspnea with exercise, cough, sputum, wheezing, coughing up blood, and pleurisy. ?GI: Denies vomiting blood, jaundice, and fecal incontinence.   Denies dysphagia or odynophagia. ?GU : Denies urinary burning, blood in urine, urinary frequency, urinary hesitancy, nocturnal urination, and urinary  incontinence. ?MS: Denies joint pain, limitation of movement, and swelling, stiffness, low back pain, extremity pain. Denies muscle weakness, cramps, atrophy.  ?Derm: Denies rash, itching, dry skin, hives, moles, warts, or unhealing ulcers.  ?Psych: Denies depression, anxiety, memory loss, suicidal ideation, hallucinations, paranoia, and confusion. ?Heme: Denies bruising, bleeding, and enlarged lymph nodes. ?Neuro:  Denies any headaches, dizziness, paresthesias. ?Endo:  Denies any problems with DM, thyroid, adrenal  function. ? ? ?Physical Exam: ?General:  Alert, well-developed, in NAD ?Head:  Normocephalic and atraumatic. ?Eyes:  Sclera clear, no icterus.   Conjunctiva pink. ?Ears:  Normal auditory acuity. ?Mouth:  No deformity or lesions.  ?Neck:  Supple; no masses . ?Lungs:  Clear throughout to auscultation.   No wheezes, crackles, or rhonchi. No acute distress. ?Heart:  Regular rate and rhythm; no murmurs. ?Abdomen:  Soft, nondistended, nontender. No masses, hepatomegaly. No obvious masses.  Normal bowel .    ?Rectal:  Deferred   ?Msk:  Symmetrical without gross deformities.Marland Kitchen ?Pulses:  Normal pulses noted. ?Extremities:  Without edema. ?Neurologic:  Alert and  oriented x4;  grossly normal neurologically. ?Skin:  Intact without significant lesions or rashes. ?Cervical Nodes:  No significant cervical adenopathy. ?Psych:  Alert and cooperative. Normal mood and affect. ? ? ?Impression / Plan:  ? 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 ? ?Marqus Macphee T. Fuller Plan  11/01/2021, 2:17 PM ?See Shea Evans, [facility], to contact our on call provider ? ? ?  ?

## 2021-11-01 NOTE — Op Note (Signed)
[facility] ?Patient Name: Eric Parks ?Procedure Date: 11/01/2021 2:02 PM ?MRN: [id number] ?Endoscopist: Ladene Artist , MD ?Age: 66 ?Referring MD:  ?Date of Birth: 02-10-1956 ?Gender: Male ?Account #: [id number] ?Procedure:                Colonoscopy ?Indications:              Hematochezia, Family history of colon cancer,  ?                          1st-degree relative ?Medicines:                Monitored Anesthesia Care ?Procedure:                Pre-Anesthesia Assessment: ?                          - Prior to the procedure, a History and Physical  ?                          was performed, and patient medications and  ?                          allergies were reviewed. The patient's tolerance of  ?                          previous anesthesia was also reviewed. The risks  ?                          and benefits of the procedure and the sedation  ?                          options and risks were discussed with the patient.  ?                          All questions were answered, and informed consent  ?                          was obtained. Prior Anticoagulants: The patient has  ?                          taken no previous anticoagulant or antiplatelet  ?                          agents. ASA Grade Assessment: II - A patient with  ?                          mild systemic disease. After reviewing the risks  ?                          and benefits, the patient was deemed in  ?                          satisfactory condition to undergo the procedure. ?  After obtaining informed consent, the colonoscope  ?                          was passed under direct vision. Throughout the  ?                          procedure, the patient's blood pressure, pulse, and  ?                          oxygen saturations were monitored continuously. The  ?                          Olympus CF-HQ190L (Serial# [id number]) Colonoscope was  ?                          introduced through the anus and advanced  to the the  ?                          cecum, identified by appendiceal orifice and  ?                          ileocecal valve. The ileocecal valve, appendiceal  ?                          orifice, and rectum were photographed. The quality  ?                          of the bowel preparation was adequate. The  ?                          colonoscopy was performed without difficulty. The  ?                          patient tolerated the procedure well. ?Scope In: 2:19:44 PM ?Scope Out: 2:33:55 PM ?Scope Withdrawal Time: 0 hours 11 minutes 55 seconds  ?Total Procedure Duration: 0 hours 14 minutes 11 seconds  ?Findings:                 The perianal and digital rectal examinations were  ?                          normal. ?                          A 4 mm polyp was found in the rectum at the dentate  ?                          line on a hemorrhoid. The polyp was sessile.  ?                          Previously biopsies and not biopsied today due to  ?                          location on a hemorrhoid. ?  A few small-mouthed diverticula were found in the  ?                          left colon. ?                          Internal hemorrhoids were found during  ?                          retroflexion. The hemorrhoids were moderate and  ?                          Grade I (internal hemorrhoids that do not prolapse). ?                          The exam was otherwise without abnormality on  ?                          direct and retroflexion views. ?Complications:            No immediate complications. Estimated blood loss:  ?                          None. ?Estimated Blood Loss:     Estimated blood loss: none. ?Impression:               - One 4 mm polyp in the rectum at the dentate line  ?                          on a hemorrhoid. Suspected squamous intraepithelial  ?                          lesion AIN-II. ?                          - Mild diverticulosis in the left colon. ?                          -  Internal hemorrhoids. ?                          - The examination was otherwise normal on direct  ?                          and retroflexion views. ?                          - No specimens collected. ?Recommendation:           - Repeat colonoscopy in 5 years for surveillance. ?                          - Patient has a contact number available for  ?                          emergencies. The signs and symptoms of potential  ?  delayed complications were discussed with the  ?                          patient. Return to normal activities tomorrow.  ?                          Written discharge instructions were provided to the  ?                          patient. ?                          - Resume previous diet. ?                          - Continue present medications. ?                          - Return to Dr. Sheryn Bison at [facility] for further  ?                          evaluation. ?Ladene Artist, MD ?11/01/2021 2:42:16 PM ?This report has been signed electronically. ?

## 2021-11-01 NOTE — Patient Instructions (Signed)
? ?Return to [facility] for further eval of polyp on dentate line on a hemorrhoid  ?   ? ?Handout on diverticulosis & hemorrhoids given to you today ? ? ?YOU HAD AN ENDOSCOPIC PROCEDURE TODAY AT [facility]:   Refer to the procedure report that was given to you for any specific questions about what was found during the examination.  If the procedure report does not answer your questions, please call your gastroenterologist to clarify.  If you requested that your care partner not be given the details of your procedure findings, then the procedure report has been included in a sealed envelope for you to review at your convenience later. ? ?YOU SHOULD EXPECT: Some feelings of bloating in the abdomen. Passage of more gas than usual.  Walking can help get rid of the air that was put into your GI tract during the procedure and reduce the bloating. If you had a lower endoscopy (such as a colonoscopy or flexible sigmoidoscopy) you may notice spotting of blood in your stool or on the toilet paper. If you underwent a bowel prep for your procedure, you may not have a normal bowel movement for a few days. ? ?Please Note:  You might notice some irritation and congestion in your nose or some drainage.  This is from the oxygen used during your procedure.  There is no need for concern and it should clear up in a day or so. ? ?SYMPTOMS TO REPORT IMMEDIATELY: ? ?Following lower endoscopy (colonoscopy or flexible sigmoidoscopy): ? Excessive amounts of blood in the stool ? Significant tenderness or worsening of abdominal pains ? Swelling of the abdomen that is new, acute ? Fever of 100?F or higher ? ? ?For urgent or emergent issues, a gastroenterologist can be reached at any hour by calling [phone]. ?Do not use MyChart messaging for urgent concerns.  ? ? ?DIET:  We do recommend a small meal at first, but then you may proceed to your regular diet.  Drink plenty of fluids but you should avoid alcoholic  beverages for 24 hours. ? ?ACTIVITY:  You should plan to take it easy for the rest of today and you should NOT DRIVE or use heavy machinery until tomorrow (because of the sedation medicines used during the test).   ? ?FOLLOW UP: ?Our staff will call the number listed on your records 48-72 hours following your procedure to check on you and address any questions or concerns that you may have regarding the information given to you following your procedure. If we do not reach you, we will leave a message.  We will attempt to reach you two times.  During this call, we will ask if you have developed any symptoms of COVID 19. If you develop any symptoms (ie: fever, flu-like symptoms, shortness of breath, cough etc.) before then, please call [phone].  If you test positive for Covid 19 in the 2 weeks post procedure, please call and report this information to [place].   ? ?If any biopsies were taken you will be contacted by phone or by letter within the next 1-3 weeks.  Please call us at [phone] if you have not heard about the biopsies in 3 weeks.  ? ? ?SIGNATURES/CONFIDENTIALITY: ?You and/or your care partner have signed paperwork which will be entered into your electronic medical record.  These signatures attest to the fact that that the information above on your After Visit Summary has been reviewed and is understood.  Full responsibility of the confidentiality of this discharge information lies with you and/or your care-partner.  ? ? ?

## 2021-11-01 NOTE — Progress Notes (Signed)
Report given to PACU, vss 

## 2021-11-01 NOTE — Progress Notes (Signed)
Pt's states no medical or surgical changes since previsit or office visit. VS assessed by C.W 

## 2021-11-03 ENCOUNTER — Telehealth: Payer: Self-pay

## 2021-11-03 NOTE — Telephone Encounter (Signed)
Left message on follow up call. 

## 2021-11-28 ENCOUNTER — Encounter: Payer: Self-pay | Admitting: Cardiology

## 2021-11-29 NOTE — Telephone Encounter (Signed)
From pt

## 2021-11-30 NOTE — Telephone Encounter (Signed)
From pt

## 2021-12-25 ENCOUNTER — Other Ambulatory Visit: Payer: Self-pay | Admitting: Cardiology

## 2022-06-21 ENCOUNTER — Other Ambulatory Visit: Payer: Self-pay | Admitting: Cardiology

## 2022-09-02 ENCOUNTER — Encounter: Payer: Self-pay | Admitting: Cardiology

## 2022-09-03 NOTE — Telephone Encounter (Signed)
From patient

## 2022-09-20 ENCOUNTER — Encounter: Payer: Self-pay | Admitting: Cardiology

## 2022-09-20 NOTE — Telephone Encounter (Signed)
From patient.

## 2022-09-24 ENCOUNTER — Encounter: Payer: Self-pay | Admitting: Cardiology

## 2022-09-24 ENCOUNTER — Ambulatory Visit: Payer: Medicare Other | Admitting: Cardiology

## 2022-09-24 VITALS — BP 120/72 | HR 84 | Ht 68.0 in | Wt 174.0 lb

## 2022-09-24 DIAGNOSIS — I10 Essential (primary) hypertension: Secondary | ICD-10-CM

## 2022-09-24 DIAGNOSIS — I351 Nonrheumatic aortic (valve) insufficiency: Secondary | ICD-10-CM

## 2022-09-24 DIAGNOSIS — E7800 Pure hypercholesterolemia, unspecified: Secondary | ICD-10-CM

## 2022-09-24 DIAGNOSIS — I77810 Thoracic aortic ectasia: Secondary | ICD-10-CM

## 2022-09-24 NOTE — Progress Notes (Signed)
Primary Physician/Referring:  Chesley Noon, MD  Patient ID: Eric Lint., male    DOB: 23-Jun-1956, 67 y.o.   MRN: [id number]  Chief complaints: Follow-up of hypertension and hyperlipidemia and abnormal stress test.  HPI:    Eric Statt.  is a 67 y.o. Caucasian male with hypertension and mild hyperlipidemia, abnormal nuclear stress test in 2018 revealing mild anterior ischemia, echocardiogram revealing mild to moderate aortic regurgitation with LVEF 45 to 50% in 2018, LVEF had normalized by medical therapy and treatment of hypertension, now presents for annual visit.    Tolerating Crestor and Benicar. Otherwise remains asymptomatic without chest pain or shortness of breath.  Past Medical History:  Diagnosis Date   AIN grade II    Anxiety    Depression    reactive   History of chicken pox    Hyperlipidemia    Hypertension    PSA elevation    Spinal stenosis    Past Surgical History:  Procedure Laterality Date   BACK SURGERY     foraminotomy   COLONOSCOPY     HERNIA REPAIR     RECTAL BIOPSY N/A    TONSILLECTOMY  1977   Social History   Tobacco Use   Smoking status: Never   Smokeless tobacco: Never  Substance Use Topics   Alcohol use: Yes    Alcohol/week: 7.0 standard drinks of alcohol    Types: 7 Standard drinks or equivalent per week    Comment: wine nightly  Marital Status: Married    Family History  Problem Relation Age of Onset   Arthritis Father    Hyperlipidemia Father    Heart disease Father    Hypertension Father    Diabetes Father    Arthritis Mother    Colon cancer Mother 3   Stomach cancer Maternal Aunt        stomach cancer or ovarian   Melanoma Maternal Grandfather    Pancreatic cancer Neg Hx    Rectal cancer Neg Hx    Colon polyps Neg Hx    Esophageal cancer Neg Hx     ROS  Review of Systems  Cardiovascular:  Negative for chest pain, dyspnea on exertion and leg swelling.   Objective  Blood pressure 120/72,  pulse 84, height 5\' 8"  (1.727 m), weight 174 lb (78.9 kg), SpO2 97 %. Body mass index is 26.46 kg/m.     09/24/2022   11:08 AM 11/01/2021    2:50 PM 11/01/2021    2:48 PM  Vitals with BMI  Height 5\' 8"     Weight 174 lbs    BMI [id number]    Systolic 123456 93 88  Diastolic 72 60 51  Pulse 84 66 66     Physical Exam Constitutional:      Appearance: He is well-developed.  Eyes:     Conjunctiva/sclera: Conjunctivae normal.  Neck:     Vascular: No JVD.  Cardiovascular:     Rate and Rhythm: Normal rate and regular rhythm.     Pulses: Normal pulses and intact distal pulses.     Heart sounds: Murmur heard.     Blowing decrescendo early diastolic murmur is present with a grade of 2/4 at the upper right sternal border.     No gallop.  Pulmonary:     Effort: Pulmonary effort is normal.     Breath sounds: Normal breath sounds.  Abdominal:     General: Bowel sounds are normal.     Palpations: Abdomen  is soft.  Musculoskeletal:        General: Normal range of motion.     Right lower leg: No edema.     Left lower leg: No edema.    Radiology: No results found.  Laboratory examination:   External labs:  Labs 09/19/2022:  MPA 10.1.  Total cholesterol 141, triglycerides 51, HDL 77, LDL 53.  Vitamin D49.7.  Hb 13.3/HCT 40.0, platelets 170, normal indicis.  Labs 05/04/2022:  BUN 16, creatinine 0.86, EGFR 94 mL, LFTs normal. Medications    Current Outpatient Medications:    APPLE CIDER VINEGAR PO, Take 1 capsule by mouth daily at 6 (six) AM., Disp: , Rfl:    Ascorbic Acid (VITAMIN C PO), Take by mouth., Disp: , Rfl:    BIOTIN PO, Take by mouth., Disp: , Rfl:    Coenzyme Q10 10 MG capsule, Take 1 capsule by mouth daily., Disp: , Rfl:    Glucosamine-Chondroit-Vit C-Mn (GLUCOSAMINE 1500 COMPLEX PO), Take by mouth., Disp: , Rfl:    Lutein 20 MG CAPS, Take 1 tablet by mouth daily., Disp: , Rfl:    minoxidil (LONITEN) 2.5 MG tablet, Take 2.5 mg by mouth daily., Disp: , Rfl:    Multiple  Vitamin (MULTIVITAMIN) tablet, Take 1 tablet by mouth daily., Disp: , Rfl:    olmesartan (BENICAR) 20 MG tablet, TAKE 1 TABLET DAILY, Disp: 90 tablet, Rfl: 3   rosuvastatin (CRESTOR) 10 MG tablet, TAKE 1 TABLET DAILY, Disp: 90 tablet, Rfl: 3   TURMERIC PO, Take 1 capsule by mouth daily at 2 PM., Disp: , Rfl:    Vitamin D, Cholecalciferol, 1000 UNITS TABS, Take by mouth., Disp: , Rfl:    ALPRAZolam (XANAX) 1 MG tablet, TAKE 1 TABLET BY MOUTH AT BEDTIME AS NEEDED FOR SLEEP (Patient not taking: Reported on 09/24/2022), Disp: , Rfl:    clorazepate (TRANXENE) 7.5 MG tablet, Take by mouth as needed. (Patient not taking: Reported on 11/01/2021), Disp: , Rfl:    tretinoin (RETIN-A) 0.1 % cream, 3-4 x week (Patient not taking: Reported on 11/01/2021), Disp: , Rfl:     Cardiac Studies:   Exercise sestamibi stress test 04/26/2017:  1. Resting EKG demonstrates normal sinus rhythm, right bundle branch block. Stress EKG is negative for myocardial ischemia. Patient exercised for 9 minutes and achieved 10.16 mets. Stress terminated due to achieving target heart rate, 89% of MPHR. No symptoms reported. Normal blood pressure response. Normal exercise capacity.  2. Perfusion imaging study demonstrates mild perfusion abnormality at the basal anterolateral wall which does not reach statistical significance. Minimal ischemia in this region cannot be completely excluded. Normal LV systolic function at 123456 without wall motion abnormality. Low risk study.  PCV ECHOCARDIOGRAM COMPLETE 08/24/2021  Normal LV systolic function with visual EF 60-65%. Left ventricle cavity is normal in size. Normal left ventricular wall thickness. Normal global wall motion. Normal diastolic filling pattern, normal LAP. Mild (Grade I) aortic regurgitation. IVC is dilated with a respiratory response of >50%. Compared to study 09/23/2019 Moderate AR is now mild otherwise no significant change.   Coronary calcium score 05/16/2022: 1.  Total calcium  score of 25. This places the patient in the 30th percentile for age and race based on the [place] data base.  -- Left Main: 0.  -- Left Anterior Descending: 25.  -- Circumflex: 0.  -- Right Coronary: 0  -- Posterior Descending Artery: 0   2.  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 Dilated aortic annulus.  EKG:   EKG 09/24/2022: Normal sinus rhythm at rate of 77 bpm, left anterior fascicular block, right bundle branch block.  Compared to 09/21/2021, no significant change.  Assessment     ICD-10-CM   1. Essential hypertension  I10 EKG 12-Lead    2. Pure hypercholesterolemia  E78.00     3. Moderate aortic regurgitation  I35.1 PCV ECHOCARDIOGRAM COMPLETE    4. Aortic root dilatation (HCC)  I77.810 PCV ECHOCARDIOGRAM COMPLETE      Recommendations:   Eric Carpenito.  is a 67 y.o. Caucasian male with hypertension and mild hyperlipidemia, abnormal nuclear stress test in 2018 revealing mild anterior ischemia, echocardiogram revealing mild to moderate aortic regurgitation with LVEF 45 to 50% in 2018, which is essentially normalized on medical therapy presents for annual visit.  He remains asymptomatic, continues to exercise regularly both on the treadmill and by walking.  1. Essential hypertension Blood pressure under excellent control, patient is presently on olmesartan, continue the same. - EKG 12-Lead  2. Pure hypercholesterolemia I reviewed his lipids, external labs reviewed, his LPA is also normal and lipids under excellent control with 10 mg of Crestor, continue the same.  I reviewed his external CT scan with coronary calcium that was performed, he is in the 30th percentile with regard to coronary calcium score.  3. Moderate aortic regurgitation Patient has mild to moderate aortic regurgitation, no change in his auscultation.  I will repeat echocardiogram in a year to follow-up on both aortic regurgitation and also aortic root dilatation that was noted  during recent coronary calcium score CT scan. - PCV ECHOCARDIOGRAM COMPLETE; Future  4. Aortic root dilatation (HCC) Small aortic aneurysm/aortic root dilatation at 4 cm noted by coronary calcium score CT in November 2023, will check his echocardiogram in a year, I will follow him up in 1 year from now. - PCV ECHOCARDIOGRAM COMPLETE; Future    Eric Prows, MD, [facility] 09/24/2022, 11:35 AM Office: [phone] Pager: [phone]

## 2022-12-03 ENCOUNTER — Encounter: Payer: Self-pay | Admitting: Cardiology

## 2022-12-03 NOTE — Telephone Encounter (Signed)
From patient.

## 2022-12-18 ENCOUNTER — Other Ambulatory Visit: Payer: Self-pay | Admitting: Cardiology

## 2023-03-18 ENCOUNTER — Telehealth: Payer: Self-pay | Admitting: Family Medicine

## 2023-03-18 NOTE — Telephone Encounter (Signed)
Pt called stating that Dr. Jacinto Halim referred him to our office to get established with you as his PCP. There is a patient message encounter dated 12/03/22 where Dr. Jacinto Halim makes the recommendation. Pt wants to know if he can establish care with you?

## 2023-03-23 NOTE — Telephone Encounter (Signed)
At request of Dr. Jacinto Halim I will see him, okay to schedule new patient/establish care appointment.

## 2023-05-13 ENCOUNTER — Ambulatory Visit (INDEPENDENT_AMBULATORY_CARE_PROVIDER_SITE_OTHER): Payer: Medicare Other | Admitting: Family Medicine

## 2023-05-13 VITALS — BP 122/60 | HR 72 | Temp 98.2°F | Ht 68.0 in | Wt 166.5 lb

## 2023-05-13 DIAGNOSIS — K6282 Dysplasia of anus: Secondary | ICD-10-CM | POA: Diagnosis not present

## 2023-05-13 DIAGNOSIS — G4700 Insomnia, unspecified: Secondary | ICD-10-CM

## 2023-05-13 DIAGNOSIS — I10 Essential (primary) hypertension: Secondary | ICD-10-CM

## 2023-05-13 DIAGNOSIS — Z800 Family history of malignant neoplasm of digestive organs: Secondary | ICD-10-CM

## 2023-05-13 DIAGNOSIS — I351 Nonrheumatic aortic (valve) insufficiency: Secondary | ICD-10-CM

## 2023-05-13 DIAGNOSIS — E785 Hyperlipidemia, unspecified: Secondary | ICD-10-CM

## 2023-05-13 DIAGNOSIS — I77810 Thoracic aortic ectasia: Secondary | ICD-10-CM

## 2023-05-13 DIAGNOSIS — M48061 Spinal stenosis, lumbar region without neurogenic claudication: Secondary | ICD-10-CM

## 2023-05-13 DIAGNOSIS — Z87898 Personal history of other specified conditions: Secondary | ICD-10-CM

## 2023-05-13 MED ORDER — CLORAZEPATE DIPOTASSIUM 3.75 MG PO TABS: 3.7500 mg | ORAL_TABLET | Freq: Every evening | ORAL | 0 refills | Status: DC | PRN

## 2023-05-13 NOTE — Patient Instructions (Addendum)
Thanks for coming in today and nice meeting you.   Tranxene if needed, but follow up if required more often. Do not use with xanax.  No other med changes at this time.  I would consider meeting with pain management or could try low dose Cymbalta for your back pain. Please follow up to discuss options further.   If any concerns on your labs I will let you know.  Take care

## 2023-05-13 NOTE — Progress Notes (Unsigned)
Subjective:  Patient ID: Eric Longest., male    DOB: 10/24/1955  Age: 67 y.o. MRN: [id number]  CC:  Chief Complaint  Patient presents with   Establish Care    Pt is doing well, no concerns notes due for a physical     HPI Eric Parks. presents for new patient to establish care.  Previously under the care of Dr. Cyndia Bent, followed by cardiology, Dr. Jacinto Halim.  Infectious disease, Dr. 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 Prior HRA in 06/08/22.  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  Anal Pap testing on July 24 with atypical squamous cells of undetermined significance, Negative high risk HPV. Plans on 6 month follow ups.   Cardiology, Dr. Jacinto Halim.  Hypertension, prior abnormal stress test, hyperlipidemia.  Visit noted from March.  Abnormal nuclear stress test in 2018 revealing mild anterior ischemia with echo indicating mild to moderate aortic regurgitation, EF 45 to 50% in 2018.  LVEF normalized by medical therapy and treatment hypertension.  Continued on Crestor, Benicar for hyperlipidemia and hypertension respectively.  Asymptomatic.  Echo 2023, EF 60 to 65%.  Normal global wall motion.  Calcium score 05/16/2022, score of 25 in the LAD.  Aneurysmal dilatation of ascending aorta up to 4 cm.  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  Plan for repeat echo in 1 year to follow-up on aortic regurgitation, mild to moderate, as well as aortic root dilatation as above.  BP Readings from Last 3 Encounters:  05/13/23 122/60  09/24/22 120/72  11/01/21 93/60   No home readings - no side effects with meds.   Lab Results  Component Value Date   CHOL 140 09/10/2019   HDL 68 09/10/2019   LDLCALC 57 09/10/2019   LDLDIRECT 51 09/10/2019   TRIG 77 09/10/2019   CHOLHDL 4 07/15/2012   Lab Results  Component Value Date   ALT 18 09/10/2019   AST 24 09/10/2019   ALKPHOS 65 09/10/2019   BILITOT 0.8 09/10/2019    Insomnia: Visit with Dr. Cyndia Bent in November 2023 noted.  Intermittent Xanax for insomnia.  Controlled substance database reviewed.  #30 last filled on 05/07/2022.  Clorazepate has been better in past - prefers this over xanax, but typically not needed.   Elevated PSA: 4.48 with free PSA 1.28 on 09/06/22 with PHI 19.6.  Urologist Dr. Carin Primrose with [facility]. Continued monitoring yearly.  Outside labs from [facility] reviewed from March 2023.  Normal CBC.  Normal vitamin D at 54, total cholesterol 134, triglycerides 56, HDL 69, LDL 53.  Borderline serum albumin of 4.9, otherwise normal CMP.  Uric acid 5.8, history of gout possibly - toe pain. No recent issues.  Not fasting today.   FH of colon CA - mother.  Dr. Russella Dar - colonoscopy last year. Repeat 5 yrs. He may try to have performed sooner.    Lumbar spinal stenosis Foraminotomy in 2013. Daily pain. Has seen NS - Dr. Venetia Maxon. No surgical recommendations. No relief with ESI. Min relief with PT.  Referred to pain mgt for possible RFA- decided against that procedure. Would like to avoid pain meds if possible.  Trouble with antidepressant in the past.   HM: Up-to-date on flu vaccine.  COVID booster within the last 6 months and has completed his Shingrix and Tdap.  Outside records requested.   History Patient Active Problem List   Diagnosis Date Noted   Spinal stenosis  08/10/2011   Depression, reactive 01/26/2011   Lumbar pain with radiation down left leg 01/26/2011   OA (osteoarthritis of the spine) 01/26/2011   Past Medical History:  Diagnosis Date   AIN grade II    Anxiety    Depression    reactive   History of chicken pox    Hyperlipidemia    Hypertension    PSA elevation    Spinal stenosis    Past Surgical History:  Procedure Laterality Date   BACK SURGERY     foraminotomy   COLONOSCOPY     HERNIA REPAIR     RECTAL BIOPSY N/A    TONSILLECTOMY  1977   No Known Allergies Prior to Admission medications   Medication  Sig Start Date End Date Taking? Authorizing Provider  ALPRAZolam (XANAX) 1 MG tablet TAKE 1 TABLET BY MOUTH AT BEDTIME AS NEEDED FOR SLEEP Patient not taking: Reported on 09/24/2022 11/18/18   [provider]  APPLE CIDER VINEGAR PO Take 1 capsule by mouth daily at 6 (six) AM.    [provider]  Ascorbic Acid (VITAMIN C PO) Take by mouth.    [provider]  BIOTIN PO Take by mouth.    [provider]  clorazepate (TRANXENE) 7.5 MG tablet Take by mouth as needed. Patient not taking: Reported on 11/01/2021 10/07/14   [provider]  Coenzyme Q10 10 MG capsule Take 1 capsule by mouth daily.    [provider]  Glucosamine-Chondroit-Vit C-Mn (GLUCOSAMINE 1500 COMPLEX PO) Take by mouth.    [provider]  Lutein 20 MG CAPS Take 1 tablet by mouth daily.    [provider]  minoxidil (LONITEN) 2.5 MG tablet Take 2.5 mg by mouth daily. 09/09/21   [provider]  Multiple Vitamin (MULTIVITAMIN) tablet Take 1 tablet by mouth daily.    [provider]  olmesartan (BENICAR) 20 MG tablet TAKE 1 TABLET DAILY 12/18/22   Yates Decamp, MD  rosuvastatin (CRESTOR) 10 MG tablet TAKE 1 TABLET DAILY 06/21/22   Yates Decamp, MD  tretinoin (RETIN-A) 0.1 % cream 3-4 x week Patient not taking: Reported on 11/01/2021 12/18/18   [provider]  TURMERIC PO Take 1 capsule by mouth daily at 2 PM.    [provider]  Vitamin D, Cholecalciferol, 1000 UNITS TABS Take by mouth.    [provider]   Social History   Socioeconomic History   Marital status: Married    Spouse name: Not on file   Number of children: 0   Years of education: Not on file   Highest education level: Not on file  Occupational History   Occupation: Clinical research associate  Tobacco Use   Smoking status: Never   Smokeless tobacco: Never  Vaping Use   Vaping status: Never Used  Substance and Sexual Activity   Alcohol use: Yes    Alcohol/week: 7.0 standard  drinks of alcohol    Types: 7 Standard drinks or equivalent per week    Comment: wine nightly   Drug use: Never   Sexual activity: Yes  Other Topics Concern   Not on file  Social History Narrative   Not on file   Social Determinants of Health   Financial Resource Strain: Low Risk  (05/07/2022)   Received from [facility], [facility]   Overall Financial Resource Strain (CARDIA)    Difficulty of Paying Living Expenses: Not hard at all  Food Insecurity: Low Risk  (01/16/2023)   Received from [facility]  Hunger Vital Sign    Worried About Running Out of Food in the Last Year: Never true    Ran Out of Food in the Last Year: Never true  Transportation Needs: No Transportation Needs (01/16/2023)   Received from [facility]    In the past 12 months, has lack of reliable transportation kept you from medical appointments, meetings, work or from getting things needed for daily living? : No  Physical Activity: Sufficiently Active (05/07/2022)   Received from [facility], [facility]   Exercise Vital Sign    Days of Exercise per Week: 7 days    Minutes of Exercise per Session: 60 min  Stress: Stress Concern Present (05/07/2022)   Received from [facility], [facility] - Occupational Stress Questionnaire    Feeling of Stress : Rather much  Social Connections: Unknown (05/08/2023)   Received from [facility]   Social Network    Social Network: Not on file  Intimate Partner Violence: Unknown (05/08/2023)   Received from [facility]   HITS    Physically Hurt: Not on file    Insult or Talk Down To: Not on file    Threaten Physical Harm: Not on file    Scream or Curse: Not on file    Review of Systems  Constitutional:  Negative for fatigue and unexpected weight change.  Eyes:  Negative for visual disturbance.  Respiratory:  Negative for cough, chest tightness and shortness of breath.   Cardiovascular:   Negative for chest pain, palpitations and leg swelling.  Gastrointestinal:  Negative for abdominal pain and blood in stool.  Neurological:  Negative for dizziness, light-headedness and headaches.   Per HPI.   Objective:   Vitals:   05/13/23 1326  BP: 122/60  Pulse: 72  Temp: 98.2 F (36.8 C)  TempSrc: Temporal  SpO2: 98%  Weight: 166 lb 8 oz (75.5 kg)  Height: 5\' 8"  (1.727 m)     Physical Exam Vitals reviewed.  Constitutional:      Appearance: He is well-developed.  HENT:     Head: Normocephalic and atraumatic.  Neck:     Vascular: No carotid bruit or JVD.  Cardiovascular:     Rate and Rhythm: Normal rate and regular rhythm.     Heart sounds: Normal heart sounds. No murmur heard. Pulmonary:     Effort: Pulmonary effort is normal.     Breath sounds: Normal breath sounds. No rales.  Musculoskeletal:     Right lower leg: No edema.     Left lower leg: No edema.  Skin:    General: Skin is warm and dry.  Neurological:     Mental Status: He is alert and oriented to person, place, and time.  Psychiatric:        Mood and Affect: Mood normal.        Assessment & Plan:  Eric Talbert. is a 67 y.o. male . Essential hypertension - Plan: Comprehensive metabolic panel  Hyperlipidemia, unspecified hyperlipidemia type - Plan: Comprehensive metabolic panel, Lipid panel  Anal dysplasia  Aortic valve insufficiency, etiology of cardiac valve disease unspecified  Aortic root dilatation (HCC)  History of elevated PSA  Family history of colon cancer in mother  Insomnia, unspecified type - Plan: clorazepate (TRANXENE) 3.75 MG tablet  Spinal stenosis of lumbar region, unspecified whether neurogenic claudication present   Meds ordered this encounter  Medications   clorazepate (TRANXENE) 3.75 MG tablet  Sig: Take 1 tablet (3.75 mg total) by mouth at bedtime as needed for anxiety or sleep.    Dispense:  30 tablet    Refill:  0   Patient Instructions   Tranxene if needed, but follow up if required more often. Do not use with xanax.  No other med changes at this time.  I would consider meeting with pain management or could try low dose Cymbalta for your back pain. Please follow up to discuss options further.      Signed,   Eric Staggers, MD [facility], [facility] 05/13/23 2:12 PM

## 2023-05-14 ENCOUNTER — Encounter: Payer: Self-pay | Admitting: Family Medicine

## 2023-05-14 LAB — COMPREHENSIVE METABOLIC PANEL
ALT: 13 U/L (ref 0–53)
AST: 18 U/L (ref 0–37)
Albumin: 4.6 g/dL (ref 3.5–5.2)
Alkaline Phosphatase: 52 U/L (ref 39–117)
BUN: 14 mg/dL (ref 6–23)
CO2: 27 meq/L (ref 19–32)
Calcium: 9.6 mg/dL (ref 8.4–10.5)
Chloride: 106 meq/L (ref 96–112)
Creatinine, Ser: 0.79 mg/dL (ref 0.40–1.50)
GFR: 90.93 mL/min (ref >60.00)
Glucose, Bld: 93 mg/dL (ref 70–99)
Potassium: 5 meq/L (ref 3.5–5.1)
Sodium: 138 meq/L (ref 135–145)
Total Bilirubin: 0.6 mg/dL (ref 0.2–1.2)
Total Protein: 7.3 g/dL (ref 6.0–8.3)

## 2023-05-14 LAB — LIPID PANEL
Cholesterol: 135 mg/dL (ref 0–200)
HDL: 68.8 mg/dL (ref >39.00)
LDL Cholesterol: 39 mg/dL (ref 0–99)
NonHDL: 66.04
Total CHOL/HDL Ratio: 2
Triglycerides: 137 mg/dL (ref 0.0–149.0)
VLDL: 27.4 mg/dL (ref 0.0–40.0)

## 2023-06-14 ENCOUNTER — Other Ambulatory Visit: Payer: Self-pay | Admitting: Cardiology

## 2023-07-07 ENCOUNTER — Encounter: Payer: Self-pay | Admitting: Family Medicine

## 2023-07-09 ENCOUNTER — Ambulatory Visit: Payer: Medicare Other | Admitting: *Deleted

## 2023-07-09 DIAGNOSIS — Z0000 Encounter for general adult medical examination without abnormal findings: Secondary | ICD-10-CM

## 2023-07-09 NOTE — Progress Notes (Signed)
 Subjective:   Eric Parks. is a 68 y.o. male who presents for Medicare Annual/Subsequent preventive examination.  Visit Complete: Virtual I connected with  Gilmore LELON Vicci Mickey. on 07/09/23 by a audio enabled telemedicine application and verified that I am speaking with the correct person using two identifiers.  Patient Location: Home  Provider Location: Home Office  I discussed the limitations of evaluation and management by telemedicine. The patient expressed understanding and agreed to proceed.  Unable to perform video visit  Vital Signs: Because this visit was a virtual/telehealth visit, some criteria may be missing or patient reported. Any vitals not documented were not able to be obtained and vitals that have been documented are patient reported.    Cardiac Risk Factors include: advanced age (>85men, >70 women);male gender     Objective:    Today's Vitals   07/09/23 1013  PainSc: 3    There is no height or weight on file to calculate BMI.     07/09/2023   10:19 AM 10/28/2013    3:17 PM  Advanced Directives  Does Patient Have a Medical Advance Directive? Yes Patient does not have advance directive;Patient has advance directive, copy not in chart  Type of Advance Directive Healthcare Power of [employer] Power of [place];Living will  Does patient want to make changes to medical advance directive?  No change requested  Copy of Healthcare Power of Attorney in Chart? No - copy requested Copy requested from family  Pre-existing out of facility DNR order (yellow form or pink MOST form)  No    Current Medications (verified) Outpatient Encounter Medications as of 07/09/2023  Medication Sig   Ascorbic Acid (VITAMIN C PO) Take 1,000 mg by mouth.   BIOTIN PO Take 5,000 mcg by mouth.   clorazepate  (TRANXENE ) 3.75 MG tablet Take 1 tablet (3.75 mg total) by mouth at bedtime as needed for anxiety or sleep.   Coenzyme Q10 10 MG capsule Take 1 capsule by mouth  daily.   Glucosamine-Chondroit-Vit C-Mn (GLUCOSAMINE 1500 COMPLEX PO) Take by mouth.   Lutein 20 MG CAPS Take 1 tablet by mouth daily.   minoxidil (LONITEN) 2.5 MG tablet Take 5 mg by mouth daily.   Multiple Vitamin (MULTIVITAMIN) tablet Take 1 tablet by mouth daily.   olmesartan  (BENICAR ) 20 MG tablet TAKE 1 TABLET DAILY   rosuvastatin (CRESTOR) 10 MG tablet TAKE 1 TABLET DAILY   tretinoin (RETIN-A) 0.1 % cream 3-4 x week   TURMERIC PO Take 1 capsule by mouth daily at 2 PM.   No facility-administered encounter medications on file as of 07/09/2023.    Allergies (verified) Patient has no known allergies.   History: Past Medical History:  Diagnosis Date   AIN grade II    Anxiety    Depression    reactive   History of chicken pox    Hyperlipidemia    Hypertension    PSA elevation    Spinal stenosis    Past Surgical History:  Procedure Laterality Date   BACK SURGERY     foraminotomy   COLONOSCOPY     HERNIA REPAIR     RECTAL BIOPSY N/A    TONSILLECTOMY  1977   Family History  Problem Relation Age of Onset   Arthritis Father    Hyperlipidemia Father    Heart disease Father    Hypertension Father    Diabetes Father    Arthritis Mother    Colon cancer Mother 41   Stomach cancer Maternal Aunt  stomach cancer or ovarian   Melanoma Maternal Grandfather    Pancreatic cancer Neg Hx    Rectal cancer Neg Hx    Colon polyps Neg Hx    Esophageal cancer Neg Hx    Social History   Socioeconomic History   Marital status: Married    Spouse name: Not on file   Number of children: 0   Years of education: Not on file   Highest education level: Not on file  Occupational History   Occupation: clinical research associate  Tobacco Use   Smoking status: Never   Smokeless tobacco: Never  Vaping Use   Vaping status: Never Used  Substance and Sexual Activity   Alcohol use: Yes    Alcohol/week: 7.0 standard drinks of alcohol    Types: 7 Standard drinks or equivalent per week    Comment: wine  nightly   Drug use: Never   Sexual activity: Yes  Other Topics Concern   Not on file  Social History Narrative   Not on file   Social Drivers of Health   Financial Resource Strain: Low Risk  (07/09/2023)   Overall Financial Resource Strain (CARDIA)    Difficulty of Paying Living Expenses: Not hard at all  Food Insecurity: No Food Insecurity (07/09/2023)   Hunger Vital Sign    Worried About Running Out of Food in the Last Year: Never true    Ran Out of Food in the Last Year: Never true  Transportation Needs: No Transportation Needs (07/09/2023)   PRAPARE - Administrator, Civil Service (Medical): No    Lack of Transportation (Non-Medical): No  Physical Activity: Sufficiently Active (07/09/2023)   Exercise Vital Sign    Days of Exercise per Week: 4 days    Minutes of Exercise per Session: 40 min  Stress: Stress Concern Present (07/09/2023)   [employer] of Occupational Health - Occupational Stress Questionnaire    Feeling of Stress : To some extent  Social Connections: Unknown (07/09/2023)   Social Connection and Isolation Panel [NHANES]    Frequency of Communication with Friends and Family: Patient unable to answer    Frequency of Social Gatherings with Friends and Family: Once a week    Attends Religious Services: More than 4 times per year    Active Member of [employer] or Organizations: No    Attends Engineer, Structural: Never    Marital Status: Married    Tobacco Counseling Counseling given: Not Answered   Clinical Intake:  Pre-visit preparation completed: Yes  Pain : 0-10 Pain Score: 3  Pain Location: Back Pain Descriptors / Indicators: Burning, Aching, Dull Pain Onset: More than a month ago Pain Frequency: Constant     Nutritional Risks: None Diabetes: No     Interpreter Needed?: No  Information entered by :: Mliss Graff LPN   Activities of Daily Living    07/09/2023   10:30 AM  In your present state of health, do you have any  difficulty performing the following activities:  Hearing? 0  Vision? 0  Difficulty concentrating or making decisions? 0  Walking or climbing stairs? 0  Dressing or bathing? 0  Doing errands, shopping? 0  Preparing Food and eating ? N  Using the Toilet? N  In the past six months, have you accidently leaked urine? N  Do you have problems with loss of bowel control? N  Managing your Medications? N  Managing your Finances? N  Housekeeping or managing your Housekeeping? N    Patient Care  Team: Levora Reyes SAUNDERS, MD as PCP - General (Family Medicine) Moul, Thelbert Gustin, MD as Referring Physician (Urology) Monita Ford, MD as Referring Physician (Internal Medicine) Ladona Heinz, MD as Consulting Physician (Cardiology) Joshua Sieving, MD as Consulting Physician (Dermatology)  Indicate any recent Medical Services you may have received from other than [facility] providers in the past year (date may be approximate).     Assessment:   This is a routine wellness examination for Nikkolas.  Hearing/Vision screen Hearing Screening - Comments:: No trouble hearing Vision Screening - Comments:: Up to date [facility] [place] [facility]   Goals Addressed             This Visit's Progress    Patient Stated       Stay active       Depression Screen    07/09/2023   10:17 AM 05/13/2023    1:28 PM 09/30/2017    1:07 PM  PHQ 2/9 Scores  PHQ - 2 Score 0 0 0  PHQ- 9 Score 0 0     Fall Risk    07/09/2023   10:14 AM 05/13/2023    1:28 PM 09/30/2017    1:07 PM  Fall Risk   Falls in the past year? 0 0 No  Number falls in past yr: 0 0   Injury with Fall? 0 0   Risk for fall due to :  No Fall Risks   Follow up Falls evaluation completed;Education provided;Falls prevention discussed Falls evaluation completed     MEDICARE RISK AT HOME: Medicare Risk at Home Any stairs in or around the home?: Yes If so, are there any without handrails?: No Home free of loose throw rugs in walkways, pet beds,  electrical cords, etc?: Yes Adequate lighting in your home to reduce risk of falls?: Yes Life alert?: No Use of a cane, walker or w/c?: No Grab bars in the bathroom?: No Shower chair or bench in shower?: Yes Elevated toilet seat or a handicapped toilet?: Yes  TIMED UP AND GO:  Was the test performed?  No    Cognitive Function:        07/09/2023   10:18 AM  6CIT Screen  What Year? 0 points  What month? 0 points  What time? 0 points  Count back from 20 0 points  Months in reverse 0 points  Repeat phrase 0 points  Total Score 0 points    Immunizations Immunization History  Administered Date(s) Administered   Fluad Quad(high Dose 65+) 04/04/2023   Influenza Split 04/10/2012   PFIZER(Purple Top)SARS-COV-2 Vaccination 07/24/2019, 08/14/2019, 02/25/2020   Pfizer Covid Bivalent Pediatric Vaccine(35mos to <79yrs) 03/20/2022, 09/23/2022, 03/26/2023   Pfizer Covid-19 Vaccine Bivalent Booster 38yrs & up 09/30/2020, 03/21/2021, 10/23/2021   Respiratory Syncytial Virus Vaccine,Recomb Aduvanted(Arexvy) 03/08/2022   Td 05/08/2021   Tdap 02/07/2011   Zoster Recombinant(Shingrix) 12/26/2016    TDAP status: Up to date  Flu Vaccine status: Up to date  Pneumococcal vaccine status: Due, Education has been provided regarding the importance of this vaccine. Advised may receive this vaccine at local pharmacy or Health Dept. Aware to provide a copy of the vaccination record if obtained from local pharmacy or Health Dept. Verbalized acceptance and understanding.  Covid-19 vaccine status: Information provided on how to obtain vaccines.   Qualifies for Shingles Vaccine? Yes   Zostavax completed No   Shingrix Completed?: No.    Education has been provided regarding the importance of this vaccine. Patient has been advised to call insurance company to  determine out of pocket expense if they have not yet received this vaccine. Advised may also receive vaccine at local pharmacy or Health Dept.  Verbalized acceptance and understanding.  Screening Tests Health Maintenance  Topic Date Due   Hepatitis C Screening  07/10/2023 (Originally 04/21/1974)   Zoster Vaccines- Shingrix (2 of 2) 10/07/2023 (Originally 02/22/2017)   Pneumonia Vaccine 35+ Years old (1 of 2 - PCV) 07/08/2024 (Originally [birth year]21/1960)   COVID-19 Vaccine (11 - 2024-25 season) 07/15/2024 (Originally 05/21/2023)   Medicare Annual Wellness (AWV)  07/08/2024   Colonoscopy  11/02/2026   DTaP/Tdap/Td (3 - Td or Tdap) 05/09/2031   INFLUENZA VACCINE  Completed   HPV VACCINES  Aged Out    Health Maintenance  There are no preventive care reminders to display for this patient.   Colorectal cancer screening: Type of screening: Colonoscopy. Completed 2023. Repeat every 5 years  Lung Cancer Screening: (Low Dose CT Chest recommended if Age 71-80 years, 20 pack-year currently smoking OR have quit w/in 15years.) does not qualify.   Lung Cancer Screening Referral:   Additional Screening:  Hepatitis C Screening: never done  Vision Screening: Recommended annual ophthalmology exams for early detection of glaucoma and other disorders of the eye. Is the patient up to date with their annual eye exam?  Yes  Who is the provider or what is the name of the office in which the patient attends annual eye exams? [facility] If pt is not established with a provider, would they like to be referred to a provider to establish care? No .   Dental Screening: Recommended annual dental exams for proper oral hygiene    Community Resource Referral / Chronic Care Management: CRR required this visit?  No   CCM required this visit?  No     Plan:     I have personally reviewed and noted the following in the patient's chart:   Medical and social history Use of alcohol, tobacco or illicit drugs  Current medications and supplements including opioid prescriptions. Patient is not currently taking opioid prescriptions. Functional ability and  status Nutritional status Physical activity Advanced directives List of other physicians Hospitalizations, surgeries, and ER visits in previous 12 months Vitals Screenings to include cognitive, depression, and falls Referrals and appointments  In addition, I have reviewed and discussed with patient certain preventive protocols, quality metrics, and best practice recommendations. A written personalized care plan for preventive services as well as general preventive health recommendations were provided to patient.     Mliss Graff, LPN   01/31/7973   After Visit Summary: (MyChart) Due to this being a telephonic visit, the after visit summary with patients personalized plan was offered to patient via MyChart   Nurse Notes:

## 2023-07-09 NOTE — Patient Instructions (Signed)
 [name] , Thank you for taking time to come for your Medicare Wellness Visit. I appreciate your ongoing commitment to your health goals. Please review the following plan we discussed and let me know if I can assist you in the future.   Screening recommendations/referrals: Colonoscopy: up to date Recommended yearly ophthalmology/optometry visit for glaucoma screening and checkup Recommended yearly dental visit for hygiene and checkup  Vaccinations: Influenza vaccine: up to date Pneumococcal vaccine: up to date Tdap vaccine: up to date      Preventive Care 65 Years and Older, Male Preventive care refers to lifestyle choices and visits with your health care provider that can promote health and wellness. What does preventive care include? A yearly physical exam. This is also called an annual well check. Dental exams once or twice a year. Routine eye exams. Ask your health care provider how often you should have your eyes checked. Personal lifestyle choices, including: Daily care of your teeth and gums. Regular physical activity. Eating a healthy diet. Avoiding tobacco and drug use. Limiting alcohol use. Practicing safe sex. Taking low doses of aspirin every day. Taking vitamin and mineral supplements as recommended by your health care provider. What happens during an annual well check? The services and screenings done by your health care provider during your annual well check will depend on your age, overall health, lifestyle risk factors, and family history of disease. Counseling  Your health care provider may ask you questions about your: Alcohol use. Tobacco use. Drug use. Emotional well-being. Home and relationship well-being. Sexual activity. Eating habits. History of falls. Memory and ability to understand (cognition). Work and work astronomer. Screening  You may have the following tests or measurements: Height, weight, and BMI. Blood pressure. Lipid and  cholesterol levels. These may be checked every 5 years, or more frequently if you are over 46 years old. Skin check. Lung cancer screening. You may have this screening every year starting at age 68 if you have a 30-pack-year history of smoking and currently smoke or have quit within the past 15 years. Fecal occult blood test (FOBT) of the stool. You may have this test every year starting at age 30. Flexible sigmoidoscopy or colonoscopy. You may have a sigmoidoscopy every 5 years or a colonoscopy every 10 years starting at age 57. Prostate cancer screening. Recommendations will vary depending on your family history and other risks. Hepatitis C blood test. Hepatitis B blood test. Sexually transmitted disease (STD) testing. Diabetes screening. This is done by checking your blood sugar (glucose) after you have not eaten for a while (fasting). You may have this done every 1-3 years. Abdominal aortic aneurysm (AAA) screening. You may need this if you are a current or former smoker. Osteoporosis. You may be screened starting at age 49 if you are at high risk. Talk with your health care provider about your test results, treatment options, and if necessary, the need for more tests. Vaccines  Your health care provider may recommend certain vaccines, such as: Influenza vaccine. This is recommended every year. Tetanus, diphtheria, and acellular pertussis (Tdap, Td) vaccine. You may need a Td booster every 10 years. Zoster vaccine. You may need this after age 79. Pneumococcal 13-valent conjugate (PCV13) vaccine. One dose is recommended after age 15. Pneumococcal polysaccharide (PPSV23) vaccine. One dose is recommended after age 52. Talk to your health care provider about which screenings and vaccines you need and how often you need them. This information is not intended to replace advice given to  you by your health care provider. Make sure you discuss any questions you have with your health care  provider. Document Released: 07/15/2015 Document Revised: 03/07/2016 Document Reviewed: 04/19/2015 Elsevier Interactive Patient Education  2017 [employer].  Fall Prevention in the Home Falls can cause injuries. They can happen to people of all ages. There are many things you can do to make your home safe and to help prevent falls. What can I do on the outside of my home? Regularly fix the edges of walkways and driveways and fix any cracks. Remove anything that might make you trip as you walk through a door, such as a raised step or threshold. Trim any bushes or trees on the path to your home. Use bright outdoor lighting. Clear any walking paths of anything that might make someone trip, such as rocks or tools. Regularly check to see if handrails are loose or broken. Make sure that both sides of any steps have handrails. Any raised decks and porches should have guardrails on the edges. Have any leaves, snow, or ice cleared regularly. Use sand or salt on walking paths during winter. Clean up any spills in your garage right away. This includes oil or grease spills. What can I do in the bathroom? Use night lights. Install grab bars by the toilet and in the tub and shower. Do not use towel bars as grab bars. Use non-skid mats or decals in the tub or shower. If you need to sit down in the shower, use a plastic, non-slip stool. Keep the floor dry. Clean up any water that spills on the floor as soon as it happens. Remove soap buildup in the tub or shower regularly. Attach bath mats securely with double-sided non-slip rug tape. Do not have throw rugs and other things on the floor that can make you trip. What can I do in the bedroom? Use night lights. Make sure that you have a light by your bed that is easy to reach. Do not use any sheets or blankets that are too big for your bed. They should not hang down onto the floor. Have a firm chair that has side arms. You can use this for support while  you get dressed. Do not have throw rugs and other things on the floor that can make you trip. What can I do in the kitchen? Clean up any spills right away. Avoid walking on wet floors. Keep items that you use a lot in easy-to-reach places. If you need to reach something above you, use a strong step stool that has a grab bar. Keep electrical cords out of the way. Do not use floor polish or wax that makes floors slippery. If you must use wax, use non-skid floor wax. Do not have throw rugs and other things on the floor that can make you trip. What can I do with my stairs? Do not leave any items on the stairs. Make sure that there are handrails on both sides of the stairs and use them. Fix handrails that are broken or loose. Make sure that handrails are as long as the stairways. Check any carpeting to make sure that it is firmly attached to the stairs. Fix any carpet that is loose or worn. Avoid having throw rugs at the top or bottom of the stairs. If you do have throw rugs, attach them to the floor with carpet tape. Make sure that you have a light switch at the top of the stairs and the bottom of the stairs.  If you do not have them, ask someone to add them for you. What else can I do to help prevent falls? Wear shoes that: Do not have high heels. Have rubber bottoms. Are comfortable and fit you well. Are closed at the toe. Do not wear sandals. If you use a stepladder: Make sure that it is fully opened. Do not climb a closed stepladder. Make sure that both sides of the stepladder are locked into place. Ask someone to hold it for you, if possible. Clearly mark and make sure that you can see: Any grab bars or handrails. First and last steps. Where the edge of each step is. Use tools that help you move around (mobility aids) if they are needed. These include: Canes. Walkers. Scooters. Crutches. Turn on the lights when you go into a dark area. Replace any light bulbs as soon as they burn  out. Set up your furniture so you have a clear path. Avoid moving your furniture around. If any of your floors are uneven, fix them. If there are any pets around you, be aware of where they are. Review your medicines with your doctor. Some medicines can make you feel dizzy. This can increase your chance of falling. Ask your doctor what other things that you can do to help prevent falls. This information is not intended to replace advice given to you by your health care provider. Make sure you discuss any questions you have with your health care provider. Document Released: 04/14/2009 Document Revised: 11/24/2015 Document Reviewed: 07/23/2014 Elsevier Interactive Patient Education  2017 [employer].

## 2023-07-19 ENCOUNTER — Encounter: Payer: Self-pay | Admitting: Family Medicine

## 2023-07-19 ENCOUNTER — Telehealth: Payer: Self-pay | Admitting: Family Medicine

## 2023-07-19 DIAGNOSIS — F418 Other specified anxiety disorders: Secondary | ICD-10-CM

## 2023-07-19 DIAGNOSIS — G4700 Insomnia, unspecified: Secondary | ICD-10-CM

## 2023-07-19 MED ORDER — CLORAZEPATE DIPOTASSIUM 15 MG PO TABS: 7.5000 mg | ORAL_TABLET | Freq: Two times a day (BID) | ORAL | 0 refills | Status: AC | PRN

## 2023-07-19 NOTE — Telephone Encounter (Signed)
Copied from CRM [id number]. Topic: Clinical - Medication Question >> Jul 19, 2023  8:12 AM Samuel Jester B wrote: Reason for CRM: Pt stated that he would like to ask for some medication to help him sleep due to his husband passing on Wednesday.

## 2023-07-19 NOTE — Telephone Encounter (Signed)
Patient called in asking for medication to assist with sleep notes partner passed on 2023/08/30 please advise

## 2023-07-19 NOTE — Addendum Note (Signed)
Addended by: Meredith Staggers R on: 07/19/2023 04:20 PM   Modules accepted: Orders

## 2023-07-19 NOTE — Telephone Encounter (Unsigned)
Copied from CRM [id number]. Topic: General - Other >> Jul 19, 2023 12:43 PM Eric Parks: Reason for CRM: patient is calling back concerning the sleep medication that he is requesting because he lost his partner Wednesday and can not sleep at all . Patient is having sleep issues right now .something safe that will help him to sleep during this difficult time  Patient say that nurse can call him  [phone]. Patient is saying he really needs something at this moment he can't sleep at all . Did relay message that we are waiting for doctor now . But he still wanted me to send a message concerning this

## 2023-07-19 NOTE — Telephone Encounter (Signed)
I called patient.  His partner of 36 years took his own life 2 days ago. Understandably he has had difficulty processing this and difficulty sleeping.  No sleep 2 nights ago. Took 1 of his 3.75mg  tranxene last night - no relief, minimal help with 2.  For now I will increase that to a 15 mg tablet, initially start with one half at bedtime, additional half for 15 mg total dose if ineffective.  Option of either 3.75 mg or half tablet of 15mg Tranxene twice daily as needed for breakthrough anxiety symptoms. He does have a support system in place with close friends at this time and a close friend from [place] is flying in and will arrive in an hour.  Still trying to comprehend what occurred, but denies any personal SI/HI.  He does think that meeting with therapist may be helpful and I will provide numbers through MyChart.  Would like to follow-up with him in the next week or 2 and check to see how things are going and adjust medications if needed at that time.  He appreciated my call.

## 2023-07-22 NOTE — Telephone Encounter (Signed)
Appt was made for 1/30

## 2023-07-23 NOTE — Telephone Encounter (Signed)
Called pt - initially tried 11.25mg  (3 of the 3.75mg ).  slept few hours woke up - tried additional 3.75mg  and no relief.   Has not tried full 15mg  dose yet.  Tonight will try 15mg  dose, add 1/2 pill if not effective ( total dose 22.5mg ) and 1/2 additional (total dose 30mg ) if that is ineffective. Understanding expressed. Hold on Ambien or other meds for now with recheck tomorrow as scheduled.

## 2023-07-23 NOTE — Telephone Encounter (Signed)
I called this patient to check on him and see if he would like to come in sooner than the 30th to discuss these matters and I did get him moved to tomorrow. Patient stated he did need something for sleep before he comes in, I told him I understood and that I would be sending his message to Dr. Neva Seat as high priority. I let him know that Dr. Neva Seat is out of the office on Tuesdays but he does check in remotely.

## 2023-07-24 ENCOUNTER — Ambulatory Visit: Payer: Medicare Other | Admitting: Family Medicine

## 2023-07-24 VITALS — BP 118/70 | HR 76 | Temp 97.9°F | Ht 68.0 in | Wt 168.2 lb

## 2023-07-24 DIAGNOSIS — G4700 Insomnia, unspecified: Secondary | ICD-10-CM

## 2023-07-24 DIAGNOSIS — F418 Other specified anxiety disorders: Secondary | ICD-10-CM | POA: Diagnosis not present

## 2023-07-24 MED ORDER — DIAZEPAM 5 MG PO TABS: 5.0000 mg | ORAL_TABLET | Freq: Two times a day (BID) | ORAL | 0 refills | Status: DC | PRN

## 2023-07-24 NOTE — Patient Instructions (Addendum)
Try valium instead of tranxene tonight and let me know how that does. Do not combine tranxene and valium. If low dose tranxene needed during day, that is ok for now. Keep follow up with therapist tomorrow. I will talk to you in a week.  Return to the clinic or go to the nearest emergency room if any of your symptoms worsen or new symptoms occur.

## 2023-07-24 NOTE — Progress Notes (Unsigned)
Subjective:  Patient ID: Eric Parks., male    DOB: July 13, 1955  Age: 68 y.o. MRN: [id number]  CC:  Chief Complaint  Patient presents with   Insomnia    Pt notes he has lost his partner 1 weeks ago, notes he cannot get to sleep or stay alseep, notes meds sent over the weekend helped some but not really, notes he thinks he needs something stronger     HPI Eric Parks. presents for   Insomnia With situational anxiety.  See previous notes, calls from Friday and yesterday.  Unfortunately his spouse committed suicide 1 week ago.  Has had significant difficulty sleeping since that time.  He does have a support structure with friends in place including a friend that traveled from [place] to be with him last week.  He had taken Tranxene 3.75 mg as needed for sleep previously.  Ineffective after recent loss above.  When I talked to him on Friday 15 mg dose prescribed with initial plan of 7.5 to 15 mg as needed.  Tonight he had taken up to a 15 mg dose but with additive doses of the 3.75 mg.  Recommended 15 mg last night with option of additional 7.5 to 15 mg for maximum dose of 30 mg if needed. Took 15mg  last night - 2-3 hours sleep. Took 2nd 15mg  - 2-3 hours sleep.  Feels like body relaxed, but brain still going.  Appointment with therapist tomorrow.  Has been writing obituary, funeral in 2 weeks.  Neighbors have been supportive, has support structure at this time.  Denies personal feeling of hopelessness, denies personal suicidal ideation or homicidal ideation.        07/24/2023    1:47 PM 07/09/2023   10:17 AM 05/13/2023    1:28 PM 09/30/2017    1:07 PM  Depression screen PHQ 2/9  Decreased Interest 3 0 0 0  Down, Depressed, Hopeless 3 0 0 0  PHQ - 2 Score 6 0 0 0  Altered sleeping 3 0 0   Tired, decreased energy 3 0 0   Change in appetite 3 0 0   Feeling bad or failure about yourself  3 0 0   Trouble concentrating 3 0 0   Moving slowly or fidgety/restless 0 0 0    Suicidal thoughts 0 0 0   PHQ-9 Score 21 0 0   Difficult doing work/chores Not difficult at all         07/24/2023    1:48 PM 05/13/2023    1:28 PM  GAD 7 : Generalized Anxiety Score  Nervous, Anxious, on Edge 3 1  Control/stop worrying 3 1  Worry too much - different things 3 1  Trouble relaxing 3 0  Restless 0 0  Easily annoyed or irritable 0 0  Afraid - awful might happen 0 1  Total GAD 7 Score 12 4  Anxiety Difficulty Not difficult at all         History Patient Active Problem List   Diagnosis Date Noted   Spinal stenosis 08/10/2011   Depression, reactive 01/26/2011   Lumbar pain with radiation down left leg 01/26/2011   OA (osteoarthritis of the spine) 01/26/2011   Past Medical History:  Diagnosis Date   AIN grade II    Anxiety    Depression    reactive   History of chicken pox    Hyperlipidemia    Hypertension    PSA elevation    Spinal stenosis  Past Surgical History:  Procedure Laterality Date   BACK SURGERY     foraminotomy   COLONOSCOPY     HERNIA REPAIR     RECTAL BIOPSY N/A    TONSILLECTOMY  1977   No Known Allergies Prior to Admission medications   Medication Sig Start Date End Date Taking? Authorizing Provider  Ascorbic Acid (VITAMIN C PO) Take 1,000 mg by mouth.   Yes [provider]  BIOTIN PO Take 5,000 mcg by mouth.   Yes [provider]  clorazepate (TRANXENE) 15 MG tablet Take 0.5-1 tablets (7.5-15 mg total) by mouth 2 (two) times daily as needed for anxiety or sleep. 07/19/23  Yes Shade Flood, MD  Coenzyme Q10 10 MG capsule Take 1 capsule by mouth daily.   Yes [provider]  Glucosamine-Chondroit-Vit C-Mn (GLUCOSAMINE 1500 COMPLEX PO) Take by mouth.   Yes [provider]  Lutein 20 MG CAPS Take 1 tablet by mouth daily.   Yes [provider]  minoxidil (LONITEN) 2.5 MG tablet Take 5 mg by mouth daily. 09/09/21  Yes [provider]  Multiple Vitamin (MULTIVITAMIN) tablet  Take 1 tablet by mouth daily.   Yes [provider]  olmesartan (BENICAR) 20 MG tablet TAKE 1 TABLET DAILY 12/18/22  Yes Yates Decamp, MD  rosuvastatin (CRESTOR) 10 MG tablet TAKE 1 TABLET DAILY 06/14/23  Yes Yates Decamp, MD  tretinoin (RETIN-A) 0.1 % cream 3-4 x week 12/18/18  Yes [provider]  TURMERIC PO Take 1 capsule by mouth daily at 2 PM.   Yes [provider]   Social History   Socioeconomic History   Marital status: Married    Spouse name: Not on file   Number of children: 0   Years of education: Not on file   Highest education level: Not on file  Occupational History   Occupation: Clinical research associate  Tobacco Use   Smoking status: Never   Smokeless tobacco: Never  Vaping Use   Vaping status: Never Used  Substance and Sexual Activity   Alcohol use: Yes    Alcohol/week: 7.0 standard drinks of alcohol    Types: 7 Standard drinks or equivalent per week    Comment: wine nightly   Drug use: Never   Sexual activity: Yes  Other Topics Concern   Not on file  Social History Narrative   Not on file   Social Drivers of Health   Financial Resource Strain: Low Risk  (07/09/2023)   Overall Financial Resource Strain (CARDIA)    Difficulty of Paying Living Expenses: Not hard at all  Food Insecurity: No Food Insecurity (07/09/2023)   Hunger Vital Sign    Worried About Running Out of Food in the Last Year: Never true    Ran Out of Food in the Last Year: Never true  Transportation Needs: No Transportation Needs (07/09/2023)   PRAPARE - Administrator, Civil Service (Medical): No    Lack of Transportation (Non-Medical): No  Physical Activity: Sufficiently Active (07/09/2023)   Exercise Vital Sign    Days of Exercise per Week: 4 days    Minutes of Exercise per Session: 40 min  Stress: Stress Concern Present (07/09/2023)   [employer] of Occupational Health - Occupational Stress Questionnaire    Feeling of Stress : To some extent  Social Connections: Unknown  (07/09/2023)   Social Connection and Isolation Panel [NHANES]    Frequency of Communication with Friends and Family: Patient unable to answer    Frequency  of Social Gatherings with Friends and Family: Once a week    Attends Religious Services: More than 4 times per year    Active Member of [employer] or Organizations: No    Attends Banker Meetings: Never    Marital Status: Married  Catering manager Violence: Not At Risk (07/09/2023)   Humiliation, Afraid, Rape, and Kick questionnaire    Fear of Current or Ex-Partner: No    Emotionally Abused: No    Physically Abused: No    Sexually Abused: No    Review of Systems Per HPI   Objective:   Vitals:   07/24/23 1350  BP: 118/70  Pulse: 76  Temp: 97.9 F (36.6 C)  TempSrc: Temporal  SpO2: 97%  Weight: 168 lb 3.2 oz (76.3 kg)  Height: 5\' 8"  (1.727 m)     Physical Exam Constitutional:      General: He is not in acute distress.    Appearance: Normal appearance. He is well-developed.  HENT:     Head: Normocephalic and atraumatic.  Cardiovascular:     Rate and Rhythm: Normal rate.  Pulmonary:     Effort: Pulmonary effort is normal.  Neurological:     Mental Status: He is alert and oriented to person, place, and time.  Psychiatric:        Mood and Affect: Mood normal.        Behavior: Behavior normal.     Comments: Good eye contact.  Appropriate responses, does not appear to be responding to internal stimuli.        Assessment & Plan:  Eric Parks. is a 68 y.o. male . Situational anxiety - Plan: diazepam (VALIUM) 5 MG tablet  Insomnia, unspecified type - Plan: diazepam (VALIUM) 5 MG tablet Very unfortunate situation as above.  Understandable difficulty with sleep.  Unfortunately he has tried multiple different doses of Tranxene with minimal improvement or short-term improvement with wakening after a few hours.  Initially will try a longer acting benzodiazepine with diazepam 5 mg, with option of 5 to 10  mg but potential side effects and risks were discussed.  Do not combine with Tranxene and update on symptoms after initial night to decide on further meds.  We did discuss Ambien and possible risk of parasomnias, will start with Valium initially.  Follow-up with therapist as planned.  Daytime Tranxene doses if needed for breakthrough anxiety.  Recheck in 1 week  Meds ordered this encounter  Medications   diazepam (VALIUM) 5 MG tablet    Sig: Take 1 tablet (5 mg total) by mouth every 12 (twelve) hours as needed for anxiety (or sleep).    Dispense:  10 tablet    Refill:  0   Patient Instructions  Try valium instead of tranxene tonight and let me know how that does. Do not combine tranxene and valium. If low dose tranxene needed during day, that is ok for now. Keep follow up with therapist tomorrow. I will talk to you in a week.  Return to the clinic or go to the nearest emergency room if any of your symptoms worsen or new symptoms occur.       Signed,   Meredith Staggers, MD [facility], [facility] 07/24/23 2:37 PM

## 2023-07-25 ENCOUNTER — Encounter: Payer: Self-pay | Admitting: Family Medicine

## 2023-07-25 DIAGNOSIS — G4700 Insomnia, unspecified: Secondary | ICD-10-CM

## 2023-07-25 MED ORDER — ZOLPIDEM TARTRATE 5 MG PO TABS: 5.0000 mg | ORAL_TABLET | Freq: Every evening | ORAL | 0 refills | Status: DC | PRN

## 2023-07-25 NOTE — Telephone Encounter (Signed)
Patient notes med did not work well, notes he would like something else

## 2023-07-26 ENCOUNTER — Encounter: Payer: Self-pay | Admitting: Family Medicine

## 2023-08-01 ENCOUNTER — Telehealth: Payer: Medicare Other | Admitting: Family Medicine

## 2023-08-01 ENCOUNTER — Encounter: Payer: Self-pay | Admitting: Family Medicine

## 2023-08-01 ENCOUNTER — Ambulatory Visit: Payer: Medicare Other | Admitting: Family Medicine

## 2023-08-01 DIAGNOSIS — G4700 Insomnia, unspecified: Secondary | ICD-10-CM | POA: Diagnosis not present

## 2023-08-01 DIAGNOSIS — F418 Other specified anxiety disorders: Secondary | ICD-10-CM

## 2023-08-01 NOTE — Telephone Encounter (Signed)
Pt was contacted and his visit has been changed to a video visit

## 2023-08-01 NOTE — Telephone Encounter (Signed)
Pt was seen in virtual appt 08/01/2023

## 2023-08-01 NOTE — Patient Instructions (Addendum)
I am sorry you are still having difficulty with sleep!  If the ZzzQuil is effective I think that is fine for now as that is likely less addictive than some of the other medications, but as we have discussed those medicines used short-term are reasonable.  You do have the option of taking a full 10 mg dose of Ambien to see if that is more effective than the 5 mg followed by 5 mg later on.  Another option would be to take the Valium either 10 mg or 15 mg if the 10 mg dose was not effective.  If the 15 mg dose is not effective we could potentially go to 20 mg but I would prefer you start with 10 or 15 mg first.  If none of these medicines are helpful it may be best to meet with a psychiatrist to discuss other medications or other treatments. I have given  you the information below for behavioral health if you need to be seen acutely.  Let me know if I can help further.    Behavioral Health:  [phone]

## 2023-08-01 NOTE — Progress Notes (Signed)
Virtual Visit via Video Note  I connected with [name]. on 08/01/23 at 1:02 PM by a video enabled telemedicine application and verified that I am speaking with the correct person using two identifiers.  Patient location: home - by self.  My location: office - [place].    I discussed the limitations, risks, security and privacy concerns of performing an evaluation and management service by telephone and the availability of in person appointments. I also discussed with the patient that there may be a patient responsible charge related to this service. The patient expressed understanding and agreed to proceed, consent obtained  Chief complaint:  Chief Complaint  Patient presents with   Follow-up    Pt is F/U on insomnia. Pt reports he has not been taking the medication due to not working. He was waking up every 2 hrs . He has been taking  zzzquil and he is getting 4 hrs -5 hrs sleep . Pt is unable to check vitals at this time     History of Present Illness: Eric Parks. is a 68 y.o. male  Insomnia: With situational anxiety, significant insomnia, and various treatment approaches attempted.  He had previously taken Tranxene 3.75 mg as needed prior to his spouse Neva Seat suicide.  Has had significant difficulty with sleep since that time.  Initially had him try higher doses of Tranxene, that was ineffective, so we tried a longer acting benzodiazepine with Valium.  That was also ineffective with higher dosing.  Ambien was prescribed January 23 with caution on potential parasomnias with RTC precautions.  Alternate option of higher dosing of Valium at either 15 or 20 mg dosing.  Had also planned on following up with therapist at that time. Telephone note January 24, took a total dose of 10 mg of Ambien, still with early wakening and difficulty getting back to sleep.  Recommended taking 10 mg all at once instead of 5 mg of 5 mg, or Valium at a dose of 15 mg, then 20  mg if ineffective.  If none of these were effective, option of behavioral health evaluation was given by MyChart message.  Today reports medications were not working well - waking up after a few hours after taking meds. He is concerned about using higher doses and then possible dependence on these meds. He decided to not use tranxene, valium or ambien.  Zquil has been used in past and effective. He is now using Zquil at bedtime - 4-5 hours of sleep, wakes up and trouble getting back to sleep. Mind starts kicking in. Trouble turning off thoughts.  2 sessions with therapist at this point - have not specifically discussed sleep. Last visit yesterday.  Did not take any higher dose of valium above 10mg .  Did not try full 10mg  ambien, only 5 mg.  Erasmo Score is next Wednesday - he may take valium the night before.  Next meeting with therapist next Thursday.    Patient Active Problem List   Diagnosis Date Noted   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   Essential hypertension 01/22/2019   Hyperlipidemia 01/22/2019   Primary insomnia 01/22/2019   PSA elevation 01/19/2016   Rising PSA level 01/10/2016   Postlaminectomy syndrome, lumbar region 05/13/2012   Spinal stenosis 08/10/2011   Lumbar pain with radiation down left leg 01/26/2011   OA (osteoarthritis of the spine) 01/26/2011   Past Medical History:  Diagnosis Date   AIN grade II    Anxiety    Depression  reactive   History of chicken pox    Hyperlipidemia    Hypertension    PSA elevation    Spinal stenosis    Past Surgical History:  Procedure Laterality Date   BACK SURGERY     foraminotomy   COLONOSCOPY     HERNIA REPAIR     RECTAL BIOPSY N/A    TONSILLECTOMY  1977   No Known Allergies Prior to Admission medications   Medication Sig Start Date End Date Taking? Authorizing Provider  Ascorbic Acid (VITAMIN C PO) Take 1,000 mg by mouth.   Yes [provider]  BIOTIN PO Take 5,000 mcg by mouth.   Yes [provider]   clorazepate (TRANXENE) 15 MG tablet Take 0.5-1 tablets (7.5-15 mg total) by mouth 2 (two) times daily as needed for anxiety or sleep. 07/19/23  Yes Shade Flood, MD  Coenzyme Q10 10 MG capsule Take 1 capsule by mouth daily.   Yes [provider]  diazepam (VALIUM) 5 MG tablet Take 1 tablet (5 mg total) by mouth every 12 (twelve) hours as needed for anxiety (or sleep). 07/24/23  Yes Shade Flood, MD  Glucosamine-Chondroit-Vit C-Mn (GLUCOSAMINE 1500 COMPLEX PO) Take by mouth.   Yes [provider]  Lutein 20 MG CAPS Take 1 tablet by mouth daily.   Yes [provider]  minoxidil (LONITEN) 2.5 MG tablet Take 5 mg by mouth daily. 09/09/21  Yes [provider]  Multiple Vitamin (MULTIVITAMIN) tablet Take 1 tablet by mouth daily.   Yes [provider]  olmesartan (BENICAR) 20 MG tablet TAKE 1 TABLET DAILY 12/18/22  Yes Yates Decamp, MD  rosuvastatin (CRESTOR) 10 MG tablet TAKE 1 TABLET DAILY 06/14/23  Yes Yates Decamp, MD  tretinoin (RETIN-A) 0.1 % cream 3-4 x week 12/18/18  Yes [provider]  TURMERIC PO Take 1 capsule by mouth daily at 2 PM.   Yes [provider]  zolpidem (AMBIEN) 5 MG tablet Take 1-2 tablets (5-10 mg total) by mouth at bedtime as needed for sleep. 07/25/23  Yes Shade Flood, MD   Social History   Socioeconomic History   Marital status: Married    Spouse name: Not on file   Number of children: 0   Years of education: Not on file   Highest education level: Not on file  Occupational History   Occupation: Clinical research associate  Tobacco Use   Smoking status: Never   Smokeless tobacco: Never  Vaping Use   Vaping status: Never Used  Substance and Sexual Activity   Alcohol use: Yes    Alcohol/week: 7.0 standard drinks of alcohol    Types: 7 Standard drinks or equivalent per week    Comment: wine nightly   Drug use: Never   Sexual activity: Yes  Other Topics Concern   Not on file  Social History Narrative   Not on  file   Social Drivers of Health   Financial Resource Strain: Low Risk  (07/09/2023)   Overall Financial Resource Strain (CARDIA)    Difficulty of Paying Living Expenses: Not hard at all  Food Insecurity: No Food Insecurity (07/09/2023)   Hunger Vital Sign    Worried About Running Out of Food in the Last Year: Never true    Ran Out of Food in the Last Year: Never true  Transportation Needs: No Transportation Needs (07/09/2023)   PRAPARE - Administrator, Civil Service (Medical): No    Lack of Transportation (Non-Medical): No  Physical Activity:  Sufficiently Active (07/09/2023)   Exercise Vital Sign    Days of Exercise per Week: 4 days    Minutes of Exercise per Session: 40 min  Stress: Stress Concern Present (07/09/2023)   [employer] of Occupational Health - Occupational Stress Questionnaire    Feeling of Stress : To some extent  Social Connections: Unknown (07/09/2023)   Social Connection and Isolation Panel [NHANES]    Frequency of Communication with Friends and Family: Patient unable to answer    Frequency of Social Gatherings with Friends and Family: Once a week    Attends Religious Services: More than 4 times per year    Active Member of [employer] or Organizations: No    Attends Banker Meetings: Never    Marital Status: Married  Catering manager Violence: Not At Risk (07/09/2023)   Humiliation, Afraid, Rape, and Kick questionnaire    Fear of Current or Ex-Partner: No    Emotionally Abused: No    Physically Abused: No    Sexually Abused: No    Observations/Objective: There were no vitals filed for this visit. Nontoxic appearance, good eye contact, appropriate responses, does not appear to be responding to internal stimuli.  All questions answered with understanding expressed.  Assessment and Plan: Insomnia, unspecified type  Situational anxiety Difficult situation, with understandable situational anxiety.  Now followed by therapist.  Has support  structure in place.  Insomnia has been very difficult to treat unfortunately.  Minimal relief with Tranxene, including higher doses, minimal relief with Valium and Ambien but did not take higher than 10 mg of Valium, and did not try the full 10 mg dose of Ambien at 1 time.  He has noticed improved control although not a deal with his equal and feels more comfortable taking this medication then the other controlled meds/sedatives.  I understand his concerns and I think if he is achieving some insomnia treatment with ZzzQuil at that is okay to continue for now, with option of changing to higher dose Valium or full 10 mg dose Ambien.  Also gave him the option for behavioral health or discussing with psychiatry if persistent issues with treating insomnia.  Phone number provided.  2-week recheck.  Follow Up Instructions:    I discussed the assessment and treatment plan with the patient. The patient was provided an opportunity to ask questions and all were answered. The patient agreed with the plan and demonstrated an understanding of the instructions.   The patient was advised to call back or seek an in-person evaluation if the symptoms worsen or if the condition fails to improve as anticipated.   Shade Flood, MD

## 2023-08-05 ENCOUNTER — Other Ambulatory Visit: Payer: Self-pay | Admitting: Family Medicine

## 2023-08-05 DIAGNOSIS — F418 Other specified anxiety disorders: Secondary | ICD-10-CM

## 2023-08-05 DIAGNOSIS — G4700 Insomnia, unspecified: Secondary | ICD-10-CM

## 2023-08-05 MED ORDER — DIAZEPAM 5 MG PO TABS: 5.0000 mg | ORAL_TABLET | Freq: Two times a day (BID) | ORAL | 0 refills | Status: DC | PRN

## 2023-08-05 NOTE — Telephone Encounter (Signed)
Last Fill: 07/24/23  Last OV: 08/01/23 Next OV: 11/11/23  Routing to provider for review/authorization.

## 2023-08-05 NOTE — Telephone Encounter (Signed)
Copied from CRM [id number]. Topic: Clinical - Medication Refill >> Aug 05, 2023  4:45 PM Drema Balzarine wrote: Most Recent Primary Care Visit:  Provider: Meredith Staggers R  Department: [facility]  Visit Type: MYCHART VIDEO VISIT  Date: 08/01/2023  Medication: Diazepam, patient needs asap - he has a funeral to go to Wednesday    Has the patient contacted their pharmacy? Yes and he sent message via MyChart   (Agent: If no, request that the patient contact the pharmacy for the refill. If patient does not wish to contact the pharmacy document the reason why and proceed with request.) (Agent: If yes, when and what did the pharmacy advise?)  Is this the correct pharmacy for this prescription? Yes If no, delete pharmacy and type the correct one.  This is the patient's preferred pharmacy:   [id number] [place], [place] - [address] AT [facility] [address] [place] [address] Phone: [phone] Fax: [phone]   Has the prescription been filled recently? Yes  Is the patient out of the medication? No, but needs meds to last at least through Wednesday   Has the patient been seen for an appointment in the last year OR does the patient have an upcoming appointment? Yes  Can we respond through MyChart? Yes  Agent: Please be advised that Rx refills may take up to 3 business days. We ask that you follow-up with your pharmacy.

## 2023-08-05 NOTE — Telephone Encounter (Signed)
Controlled substance database reviewed.  We have discussed various medications recently as well for controlling acute insomnia.  Last valium 5 mg #10 filled on 07/24/2023.  Discussed clorazepate and zolpidem at previous visits.  Noted on controlled substance database.  Additional #15 of Valium ordered.

## 2023-09-12 ENCOUNTER — Encounter: Payer: Self-pay | Admitting: Cardiology

## 2023-09-13 ENCOUNTER — Encounter: Payer: Self-pay | Admitting: Family Medicine

## 2023-09-13 ENCOUNTER — Encounter: Payer: Self-pay | Admitting: Cardiology

## 2023-09-13 ENCOUNTER — Ambulatory Visit (HOSPITAL_COMMUNITY): Payer: Medicare Other | Attending: Cardiovascular Disease

## 2023-09-13 DIAGNOSIS — I351 Nonrheumatic aortic (valve) insufficiency: Secondary | ICD-10-CM | POA: Insufficient documentation

## 2023-09-13 DIAGNOSIS — I77810 Thoracic aortic ectasia: Secondary | ICD-10-CM | POA: Insufficient documentation

## 2023-09-13 DIAGNOSIS — I10 Essential (primary) hypertension: Secondary | ICD-10-CM

## 2023-09-13 DIAGNOSIS — E785 Hyperlipidemia, unspecified: Secondary | ICD-10-CM

## 2023-09-13 LAB — ECHOCARDIOGRAM COMPLETE
Area-P 1/2: 3.53 cm2
P 1/2 time: 565 ms
S' Lateral: 3.1 cm

## 2023-09-13 NOTE — Progress Notes (Signed)
 Normal LV systolic function.  Mild aortic regurgitation unchanged from previous a year and a half ago.  Mild dilatation of the aortic root at 40 mm, overall very stable echocardiogram.  Aortic root dilatation related to aortic regurgitation.

## 2023-09-16 ENCOUNTER — Other Ambulatory Visit (INDEPENDENT_AMBULATORY_CARE_PROVIDER_SITE_OTHER)

## 2023-09-16 DIAGNOSIS — E785 Hyperlipidemia, unspecified: Secondary | ICD-10-CM

## 2023-09-16 DIAGNOSIS — I10 Essential (primary) hypertension: Secondary | ICD-10-CM | POA: Diagnosis not present

## 2023-09-16 LAB — COMPREHENSIVE METABOLIC PANEL
ALT: 16 U/L (ref 0–53)
AST: 18 U/L (ref 0–37)
Albumin: 4.5 g/dL (ref 3.5–5.2)
Alkaline Phosphatase: 51 U/L (ref 39–117)
BUN: 14 mg/dL (ref 6–23)
CO2: 28 meq/L (ref 19–32)
Calcium: 9.6 mg/dL (ref 8.4–10.5)
Chloride: 107 meq/L (ref 96–112)
Creatinine, Ser: 0.78 mg/dL (ref 0.40–1.50)
GFR: 91.06 mL/min (ref >60.00)
Glucose, Bld: 85 mg/dL (ref 70–99)
Potassium: 4.6 meq/L (ref 3.5–5.1)
Sodium: 142 meq/L (ref 135–145)
Total Bilirubin: 0.8 mg/dL (ref 0.2–1.2)
Total Protein: 6.8 g/dL (ref 6.0–8.3)

## 2023-09-16 LAB — LIPID PANEL
Cholesterol: 141 mg/dL (ref 0–200)
HDL: 75.2 mg/dL (ref >39.00)
LDL Cholesterol: 54 mg/dL (ref 0–99)
NonHDL: 65.68
Total CHOL/HDL Ratio: 2
Triglycerides: 59 mg/dL (ref 0.0–149.0)
VLDL: 11.8 mg/dL (ref 0.0–40.0)

## 2023-09-17 ENCOUNTER — Encounter: Payer: Self-pay | Admitting: Family Medicine

## 2023-09-17 ENCOUNTER — Other Ambulatory Visit: Payer: Medicare Other

## 2023-09-24 ENCOUNTER — Encounter: Payer: Self-pay | Admitting: Cardiology

## 2023-09-24 ENCOUNTER — Ambulatory Visit: Payer: Medicare Other | Attending: Cardiology | Admitting: Cardiology

## 2023-09-24 VITALS — BP 110/56 | HR 77 | Resp 16 | Ht 68.0 in | Wt 163.6 lb

## 2023-09-24 DIAGNOSIS — I10 Essential (primary) hypertension: Secondary | ICD-10-CM | POA: Diagnosis present

## 2023-09-24 DIAGNOSIS — E7800 Pure hypercholesterolemia, unspecified: Secondary | ICD-10-CM | POA: Insufficient documentation

## 2023-09-24 DIAGNOSIS — I351 Nonrheumatic aortic (valve) insufficiency: Secondary | ICD-10-CM | POA: Diagnosis present

## 2023-09-24 DIAGNOSIS — I77810 Thoracic aortic ectasia: Secondary | ICD-10-CM | POA: Diagnosis not present

## 2023-09-24 NOTE — Patient Instructions (Signed)
 Medication Instructions:  Your physician recommends that you continue on your current medications as directed. Please refer to the Current Medication list given to you today.  *If you need a refill on your cardiac medications before your next appointment, please call your pharmacy*   Lab Work: none If you have labs (blood work) drawn today and your tests are completely normal, you will receive your results only by: MyChart Message (if you have MyChart) OR A paper copy in the mail If you have any lab test that is abnormal or we need to change your treatment, we will call you to review the results.   Testing/Procedures: none   Follow-Up: At [facility], you and your health needs are our priority.  As part of our continuing mission to provide you with exceptional heart care, we have created designated Provider Care Teams.  These Care Teams include your primary Cardiologist (physician) and Advanced Practice Providers (APPs -  Physician Assistants and Nurse Practitioners) who all work together to provide you with the care you need, when you need it.  We recommend signing up for the patient portal called "MyChart".  Sign up information is provided on this After Visit Summary.  MyChart is used to connect with patients for Virtual Visits (Telemedicine).  Patients are able to view lab/test results, encounter notes, upcoming appointments, etc.  Non-urgent messages can be sent to your provider as well.   To learn more about what you can do with MyChart, go to [email or web address].    Your next appointment:   12 month(s)  Provider:   Yates Decamp, MD     Other Instructions   1st Floor: - Lobby - Registration  - Pharmacy  - Lab - Cafe  2nd Floor: - PV Lab - Diagnostic Testing (echo, CT, nuclear med)  3rd Floor: - Vacant  4th Floor: - TCTS (cardiothoracic surgery) - AFib Clinic - Structural Heart Clinic - Vascular Surgery  - Vascular Ultrasound  5th Floor: -  HeartCare Cardiology (general and EP) - Clinical Pharmacy for coumadin, hypertension, lipid, weight-loss medications, and med management appointments    Valet parking services will be available as well.

## 2023-09-24 NOTE — Progress Notes (Signed)
 Cardiology Office Note:  .   Date:  09/24/2023  ID:  Eric Parks., DOB March 10, 1956, MRN [id number] PCP: Shade Flood, MD  [facility] Providers Cardiologist:  Yates Decamp, MD   History of Present Illness: .   Eric Parks. is a 68 y.o. Caucasian male with hypertension and mild hyperlipidemia, abnormal nuclear stress test in 2018 revealing mild anterior ischemia, echocardiogram revealing mild to moderate aortic regurgitation with LVEF 45 to 50% in 2018, LVEF had normalized by medical therapy and treatment of hypertension, now presents for annual visit.   Tolerating Crestor and Benicar. Otherwise remains asymptomatic without chest pain or shortness of breath.   Discussed the use of AI scribe software for clinical note transcription with the patient, who gave verbal consent to proceed.  History of Present Illness   The patient, with a history of aortic root dilation and aortic regurgitation, presents for a routine follow-up. He reports an incidental finding of low blood pressure during a recent urology appointment. He denies symptoms of hypotension such as dizziness or fainting. He also reports an intentional weight loss of approximately eight pounds through increased physical activity, specifically walking for an hour daily. He denies cardiac symptoms such as chest pain, shortness of breath, or palpitations, only occasional 'twinges' that resolve spontaneously. He also reports a daily alcohol intake of one to one and a half glasses of wine.      Labs   Lab Results  Component Value Date   CHOL 141 09/16/2023   HDL 75.20 09/16/2023   LDLCALC 54 09/16/2023   LDLDIRECT 51 09/10/2019   TRIG 59.0 09/16/2023   CHOLHDL 2 09/16/2023   Lab Results  Component Value Date   NA 142 09/16/2023   K 4.6 09/16/2023   CO2 28 09/16/2023   GLUCOSE 85 09/16/2023   BUN 14 09/16/2023   CREATININE 0.78 09/16/2023   CALCIUM 9.6 09/16/2023   GFR 91.06 09/16/2023   GFRNONAA  94 09/10/2019      Latest Ref Rng & Units 09/16/2023    9:44 AM 05/13/2023    2:23 PM 09/10/2019   10:37 AM  BMP  Glucose 70 - 99 mg/dL 85  93  92   BUN 6 - 23 mg/dL 14  14  14    Creatinine 0.40 - 1.50 mg/dL 4.09  8.11  9.14   BUN/Creat Ratio 10 - 24   18   Sodium 135 - 145 mEq/L 142  138  142   Potassium 3.5 - 5.1 mEq/L 4.6  5.0  4.6   Chloride 96 - 112 mEq/L 107  106  105   CO2 19 - 32 mEq/L 28  27  24    Calcium 8.4 - 10.5 mg/dL 9.6  9.6  9.7       Latest Ref Rng & Units 09/18/2017   12:33 PM 07/15/2012   10:53 AM 01/26/2011   11:12 AM  CBC  WBC 4.0 - 10.5 K/uL 6.0  5.1  5.3   Hemoglobin 13.0 - 17.0 g/dL 78.2  95.6  21.3   Hematocrit 39.0 - 52.0 % 42.2  42.0  44.2   Platelets 150 - 400 K/uL 144  156.0  152.0    No results found for: "HGBA1C"  Lab Results  Component Value Date   TSH 1.08 07/15/2012    Review of Systems  Cardiovascular:  Negative for chest pain, dyspnea on exertion and leg swelling.   Physical Exam:   VS:  BP (!) 110/56 (BP  Location: Left Arm, Patient Position: Sitting, Cuff Size: Normal)   Pulse 77   Resp 16   Ht 5\' 8"  (1.727 m)   Wt 163 lb 9.6 oz (74.2 kg)   SpO2 97%   BMI 24.88 kg/m    Wt Readings from Last 3 Encounters:  09/24/23 163 lb 9.6 oz (74.2 kg)  07/24/23 168 lb 3.2 oz (76.3 kg)  05/13/23 166 lb 8 oz (75.5 kg)    Physical Exam Neck:     Vascular: No JVD.  Cardiovascular:     Rate and Rhythm: Normal rate and regular rhythm.     Pulses: Intact distal pulses.     Heart sounds: S1 normal and S2 normal. Murmur heard.     Early systolic murmur is present with a grade of 2/6 at the upper right sternal border.     No gallop.  Pulmonary:     Effort: Pulmonary effort is normal.     Breath sounds: Normal breath sounds.  Abdominal:     General: Bowel sounds are normal.     Palpations: Abdomen is soft.  Musculoskeletal:     Right lower leg: No edema.     Left lower leg: No edema.    Studies Reviewed: Marland Kitchen    Coronary calcium score  05/16/2022: 1.  Total calcium score of 25. This places the patient in the 30th percentile for age and race based on the [place] data base.  -- Left Main: 0.  -- Left Anterior Descending: 25.  -- Circumflex: 0.  -- Right Coronary: 0  -- Posterior Descending Artery: 0   2.  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 Dilated aortic annulus.   ECHOCARDIOGRAM COMPLETE 09/13/2023  1. Left ventricular ejection fraction, by estimation, is 60 to 65%. The left ventricle has normal function. The left ventricle has no regional wall motion abnormalities. Left ventricular diastolic parameters were normal. The average left ventricular global longitudinal strain is -19.9 %. The global longitudinal strain is normal. 2. Right ventricular systolic function is normal. The right ventricular size is normal. 3. The mitral valve is abnormal. Trivial mitral valve regurgitation. No evidence of mitral stenosis. 4. The aortic valve is tricuspid. Aortic valve regurgitation is mild. No aortic stenosis is present. 5. Aortic dilatation noted. There is mild dilatation of the ascending aorta, measuring 40 mm. 6. The inferior vena cava is normal in size with greater than 50% respiratory variability, suggesting right atrial pressure of 3 mmHg.  EKG:    EKG Interpretation Date/Time:  Tuesday September 24 2023 11:21:37 EDT Ventricular Rate:  80 PR Interval:  168 QRS Duration:  142 QT Interval:  416 QTC Calculation: 479 R Axis:   -82  Text Interpretation: EKG 09/24/2023: Normal sinus rhythm at the rate of 80 bpm, left intrafascicular block.  Right bundle branch block.  Bifascicular block.  Compared to 09/24/2022, no change. Confirmed by Delrae Rend [phone]) on 09/24/2023 12:12:13 PM   Medications and allergies    No Known Allergies   Current Outpatient Medications:    Ascorbic Acid (VITAMIN C PO), Take 1,000 mg by mouth., Disp: , Rfl:    BIOTIN PO, Take 5,000 mcg by mouth., Disp: , Rfl:    Coenzyme  Q10 10 MG capsule, Take 1 capsule by mouth daily., Disp: , Rfl:    diazepam (VALIUM) 5 MG tablet, Take 1 tablet (5 mg total) by mouth every 12 (twelve) hours as needed for anxiety (or sleep)., Disp: 15 tablet, Rfl: 0  Glucosamine-Chondroit-Vit C-Mn (GLUCOSAMINE 1500 COMPLEX PO), Take by mouth., Disp: , Rfl:    Lutein 20 MG CAPS, Take 1 tablet by mouth daily., Disp: , Rfl:    minoxidil (LONITEN) 2.5 MG tablet, Take 5 mg by mouth daily., Disp: , Rfl:    Multiple Vitamin (MULTIVITAMIN) tablet, Take 1 tablet by mouth daily., Disp: , Rfl:    olmesartan (BENICAR) 20 MG tablet, TAKE 1 TABLET DAILY, Disp: 90 tablet, Rfl: 3   rosuvastatin (CRESTOR) 10 MG tablet, TAKE 1 TABLET DAILY, Disp: 90 tablet, Rfl: 3   tretinoin (RETIN-A) 0.1 % cream, 3-4 x week, Disp: , Rfl:    TURMERIC PO, Take 1 capsule by mouth daily at 2 PM., Disp: , Rfl:    zolpidem (AMBIEN) 5 MG tablet, Take 1-2 tablets (5-10 mg total) by mouth at bedtime as needed for sleep., Disp: 10 tablet, Rfl: 0   clorazepate (TRANXENE) 15 MG tablet, Take 0.5-1 tablets (7.5-15 mg total) by mouth 2 (two) times daily as needed for anxiety or sleep., Disp: 20 tablet, Rfl: 0   ASSESSMENT AND PLAN: .      ICD-10-CM   1. Aortic root dilatation (HCC)  I77.810 EKG 12-Lead    2. Mild aortic regurgitation  I35.1     3. Essential hypertension  I10     4. Pure hypercholesterolemia  E78.00       No orders of the defined types were placed in this encounter.   There are no discontinued medications.   Assessment and Plan    Aortic root dilatation   Aortic root dilatation remains stable at 4 cm with no change from previous measurements. It is managed with omisartan to prevent further dilatation. Continue omisartan 20 mg once daily.  Aortic regurgitation   Aortic regurgitation is very mild, improved from previous mild to moderate status, and remains asymptomatic. Maintain current management.  Mild elevation in coronary calcium score Well-managed with a  coronary calcium score in the 30th percentile, indicating low risk for myocardial infarction or stroke. LDL is controlled at 54 with statin therapy. Repeating the coronary calcium score is unnecessary on statin therapy. Continue Crestor 20 mg daily. No repeat cardiac calcium test needed.  Hypotension   Blood pressure is 110/56, likely due to recent weight loss, with no symptoms of dizziness or syncope. It is considered optimal without symptoms.  Follow-up   Cardiac conditions are well-managed with low risk for myocardial infarction or stroke. Regular follow-up is necessary. Schedule follow-up in one year and perform echocardiogram every two years as clinically indicated.        Signed,  Yates Decamp, MD, [facility] 09/24/2023, 12:26 PM [facility] [address] [place] [address] [place], [place] [address] Phone: [phone]. Fax:  [phone]

## 2023-11-11 ENCOUNTER — Ambulatory Visit (INDEPENDENT_AMBULATORY_CARE_PROVIDER_SITE_OTHER)
Admission: RE | Admit: 2023-11-11 | Discharge: 2023-11-11 | Disposition: A | Discharge status: Home / Self Care | Source: Ambulatory Visit | Attending: Family Medicine | Admitting: Family Medicine

## 2023-11-11 ENCOUNTER — Ambulatory Visit: Payer: Medicare Other | Admitting: Family Medicine

## 2023-11-11 ENCOUNTER — Encounter: Payer: Self-pay | Admitting: Family Medicine

## 2023-11-11 ENCOUNTER — Other Ambulatory Visit

## 2023-11-11 ENCOUNTER — Other Ambulatory Visit: Payer: Self-pay

## 2023-11-11 ENCOUNTER — Ambulatory Visit (INDEPENDENT_AMBULATORY_CARE_PROVIDER_SITE_OTHER): Admitting: Family Medicine

## 2023-11-11 ENCOUNTER — Other Ambulatory Visit (INDEPENDENT_AMBULATORY_CARE_PROVIDER_SITE_OTHER)

## 2023-11-11 VITALS — BP 118/62 | HR 70 | Temp 97.9°F | Ht 68.0 in | Wt 161.2 lb

## 2023-11-11 DIAGNOSIS — M79674 Pain in right toe(s): Secondary | ICD-10-CM

## 2023-11-11 DIAGNOSIS — E785 Hyperlipidemia, unspecified: Secondary | ICD-10-CM

## 2023-11-11 DIAGNOSIS — F418 Other specified anxiety disorders: Secondary | ICD-10-CM | POA: Diagnosis not present

## 2023-11-11 DIAGNOSIS — I10 Essential (primary) hypertension: Secondary | ICD-10-CM | POA: Diagnosis not present

## 2023-11-11 DIAGNOSIS — Z0184 Encounter for antibody response examination: Secondary | ICD-10-CM

## 2023-11-11 LAB — URIC ACID: Uric Acid, Serum: 4.7 mg/dL (ref 4.0–7.8)

## 2023-11-11 MED ORDER — MELOXICAM 7.5 MG PO TABS: 7.5000 mg | ORAL_TABLET | Freq: Every day | ORAL | 0 refills | Status: DC

## 2023-11-11 NOTE — Progress Notes (Signed)
 Subjective:  Patient ID: Eric Parks., male    DOB: 04/12/1956  Age: 68 y.o. MRN: [id number]  CC:  Chief Complaint  Patient presents with   Medical Management of Chronic Issues    Pt is doing okay, fasting    Foot Pain    Notes Big toe of the Rt foot feels painful for 2-3 weeks, no visual acuity    Labs Only    Patient requested titer for measles immunity     HPI Eric Parks. presents for   Concerns above  Anxiety/insomnia Treated in January. Situational anxiety with significant insomnia treated in January after spouse committing suicide.  Various treatments attempted.  Approximately was using ZzzQuil at his January 30 visit.  Ambien  or Valium  were discussed as options, or psychiatry eval. Still meeting with a therapist once per week. Sleeping better.  He his hesitant to start any new meds. Feels like he is normal with current time of symptoms. Rare transient times of anxiety or feeling down in past but not debilitating, transient, and explainable trigger. Rare need for med for slepep in past. No SI/HI.  Looking into [facility].      11/11/2023   11:47 AM 07/24/2023    1:47 PM 07/09/2023   10:17 AM 05/13/2023    1:28 PM 09/30/2017    1:07 PM  Depression screen PHQ 2/9  Decreased Interest 1 3 0 0 0  Down, Depressed, Hopeless 1 3 0 0 0  PHQ - 2 Score 2 6 0 0 0  Altered sleeping 0 3 0 0   Tired, decreased energy 1 3 0 0   Change in appetite 0 3 0 0   Feeling bad or failure about yourself  0 3 0 0   Trouble concentrating 0 3 0 0   Moving slowly or fidgety/restless 0 0 0 0   Suicidal thoughts 0 0 0 0   PHQ-9 Score 3 21 0 0   Difficult doing work/chores  Not difficult at all         11/11/2023   11:48 AM 07/24/2023    1:48 PM 05/13/2023    1:28 PM  GAD 7 : Generalized Anxiety Score  Nervous, Anxious, on Edge 1 3 1   Control/stop worrying 1 3 1   Worry too much - different things 1 3 1   Trouble relaxing 1 3 0  Restless 0 0 0  Easily annoyed or  irritable 0 0 0  Afraid - awful might happen 0 0 1  Total GAD 7 Score 4 12 4   Anxiety Difficulty  Not difficult at all    Hyperlipidemia: Continued on Crestor at recent cardiology visit, coronary calcium score in the 30th percentile previously. Lab Results  Component Value Date   CHOL 141 09/16/2023   HDL 75.20 09/16/2023   LDLCALC 54 09/16/2023   LDLDIRECT 51 09/10/2019   TRIG 59.0 09/16/2023   CHOLHDL 2 09/16/2023   Lab Results  Component Value Date   ALT 16 09/16/2023   AST 18 09/16/2023   ALKPHOS 51 09/16/2023   BILITOT 0.8 09/16/2023    Hypertension: With history of aortic root dilatation, mild aortic regurgitation, office visit with cardiology, Dr. Berry Bristol noted on March 25. Hypotension at that time with blood pressure of 110/56.  Likely due to recent weight loss with no symptoms of dizziness or syncope, no med changes.  Asymptomatic mild aortic regurgitation.  On olmesartan  20 mg daily, prior aortic root dilatation was stable at 4 cm.  1 year follow-up with echocardiogram every 2 years.   BP Readings from Last 3 Encounters:  11/11/23 118/62  09/24/23 (!) 110/56  07/24/23 118/70   Lab Results  Component Value Date   CREATININE 0.78 09/16/2023   Right great toe pain: Past few weeks. No hx of gout known, but has had some foot pain in past. NKI. Feels more sore to walk. Pain off and on and affects walking, hurts to bend. No new footwear.  Prednisone causes jitteryness.   MMR titer Request titer today for measles immunity status. Immunization History  Administered Date(s) Administered   Fluad Quad(high Dose 65+) 04/11/2020, 04/04/2023   Fluad Trivalent(High Dose 65+) 04/13/2013   Influenza Inj Mdck Quad Pf 04/22/2017, 04/15/2018   Influenza Split 04/10/2012   Influenza-Unspecified 04/04/2023   PFIZER Comirnaty(Gray Top)Covid-19 Tri-Sucrose Vaccine 10/03/2020   PFIZER(Purple Top)SARS-COV-2 Vaccination 07/24/2019, 08/14/2019, 02/25/2020   Pfizer Covid Bivalent  Pediatric Vaccine(59mos to <47yrs) 03/20/2022, 09/23/2022, 03/26/2023   Pfizer Covid-19 Vaccine Bivalent Booster 29yrs & up 09/30/2020, 03/21/2021, 10/23/2021   Pfizer(Comirnaty)Fall Seasonal Vaccine 12 years and older 09/26/2023   Pneumococcal Polysaccharide-23 03/27/2019   RSV,unspecified 03/08/2022   Respiratory Syncytial Virus Vaccine,Recomb Aduvanted(Arexvy) 03/08/2022   Td 05/08/2021   Tdap 02/07/2011   Zoster Recombinant(Shingrix) 12/26/2016   Zoster, Live 12/26/2016      History Patient Active Problem List   Diagnosis Date Noted   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   Essential hypertension 01/22/2019   Hyperlipidemia 01/22/2019   PSA elevation 01/19/2016   Rising PSA level 01/10/2016   Postlaminectomy syndrome, lumbar region 05/13/2012   Spinal stenosis 08/10/2011   Lumbar pain with radiation down left leg 01/26/2011   OA (osteoarthritis of the spine) 01/26/2011   Past Medical History:  Diagnosis Date   AIN grade II    Anxiety    Depression    reactive   History of chicken pox    Hyperlipidemia    Hypertension    PSA elevation    Spinal stenosis    Past Surgical History:  Procedure Laterality Date   BACK SURGERY     foraminotomy   COLONOSCOPY     HERNIA REPAIR     RECTAL BIOPSY N/A    TONSILLECTOMY  1977   No Known Allergies Prior to Admission medications   Medication Sig Start Date End Date Taking? Authorizing Provider  Ascorbic Acid (VITAMIN C PO) Take 1,000 mg by mouth.    [provider]  BIOTIN PO Take 5,000 mcg by mouth.    [provider]  clorazepate  (TRANXENE ) 15 MG tablet Take 0.5-1 tablets (7.5-15 mg total) by mouth 2 (two) times daily as needed for anxiety or sleep. 07/19/23   Benjiman Bras, MD  Coenzyme Q10 10 MG capsule Take 1 capsule by mouth daily.    [provider]  diazepam  (VALIUM ) 5 MG tablet Take 1 tablet (5 mg total) by mouth every 12 (twelve) hours as needed for anxiety (or sleep). 08/05/23   Benjiman Bras, MD   Glucosamine-Chondroit-Vit C-Mn (GLUCOSAMINE 1500 COMPLEX PO) Take by mouth.    [provider]  Lutein 20 MG CAPS Take 1 tablet by mouth daily.    [provider]  minoxidil (LONITEN) 2.5 MG tablet Take 5 mg by mouth daily. 09/09/21   [provider]  Multiple Vitamin (MULTIVITAMIN) tablet Take 1 tablet by mouth daily.    [provider]  olmesartan  (BENICAR ) 20 MG tablet TAKE 1 TABLET DAILY 12/18/22   Knox Perl, MD  rosuvastatin (  CRESTOR) 10 MG tablet TAKE 1 TABLET DAILY 06/14/23   Knox Perl, MD  tretinoin (RETIN-A) 0.1 % cream 3-4 x week 12/18/18   [provider]  TURMERIC PO Take 1 capsule by mouth daily at 2 PM.    [provider]   Social History   Socioeconomic History   Marital status: Widowed    Spouse name: Not on file   Number of children: 0   Years of education: Not on file   Highest education level: Master's degree (e.g., MA, MS, MEng, MEd, MSW, MBA)  Occupational History   Occupation: Clinical research associate  Tobacco Use   Smoking status: Never   Smokeless tobacco: Never  Vaping Use   Vaping status: Never Used  Substance and Sexual Activity   Alcohol use: Yes    Alcohol/week: 7.0 standard drinks of alcohol    Types: 7 Standard drinks or equivalent per week    Comment: wine nightly   Drug use: Never   Sexual activity: Yes  Other Topics Concern   Not on file  Social History Narrative   Not on file   Social Drivers of Health   Financial Resource Strain: Low Risk  (11/07/2023)   Overall Financial Resource Strain (CARDIA)    Difficulty of Paying Living Expenses: Not hard at all  Food Insecurity: No Food Insecurity (11/07/2023)   Hunger Vital Sign    Worried About Running Out of Food in the Last Year: Never true    Ran Out of Food in the Last Year: Never true  Transportation Needs: No Transportation Needs (11/07/2023)   PRAPARE - Administrator, Civil Service (Medical): No    Lack of Transportation (Non-Medical): No   Physical Activity: Sufficiently Active (11/07/2023)   Exercise Vital Sign    Days of Exercise per Week: 6 days    Minutes of Exercise per Session: 60 min  Stress: Stress Concern Present (11/07/2023)   [employer] of Occupational Health - Occupational Stress Questionnaire    Feeling of Stress : To some extent  Social Connections: Moderately Integrated (11/07/2023)   Social Connection and Isolation Panel [NHANES]    Frequency of Communication with Friends and Family: More than three times a week    Frequency of Social Gatherings with Friends and Family: Three times a week    Attends Religious Services: More than 4 times per year    Active Member of Clubs or Organizations: Yes    Attends Banker Meetings: More than 4 times per year    Marital Status: Widowed  Intimate Partner Violence: Not At Risk (07/09/2023)   Humiliation, Afraid, Rape, and Kick questionnaire    Fear of Current or Ex-Partner: No    Emotionally Abused: No    Physically Abused: No    Sexually Abused: No    Review of Systems   Objective:   Vitals:   11/11/23 1143  BP: 118/62  Pulse: 70  Temp: 97.9 F (36.6 C)  TempSrc: Temporal  SpO2: 98%  Weight: 161 lb 3.2 oz (73.1 kg)  Height: 5\' 8"  (1.727 m)     Physical Exam Vitals reviewed.  Constitutional:      Appearance: He is well-developed.  HENT:     Head: Normocephalic and atraumatic.  Neck:     Vascular: No carotid bruit or JVD.  Cardiovascular:     Rate and Rhythm: Normal rate and regular rhythm.     Heart sounds: Normal heart sounds. No murmur heard. Pulmonary:  Effort: Pulmonary effort is normal.     Breath sounds: Normal breath sounds. No rales.  Musculoskeletal:     Right lower leg: No edema.     Left lower leg: No edema.     Comments: Right foot, tender to palpation over the first MTP without appreciable warmth or erythema, no appreciable soft tissue swelling.  Slight discomfort with motion.  Neurovascular intact distally.  No  other focal tenderness.  Skin:    General: Skin is warm and dry.  Neurological:     Mental Status: He is alert and oriented to person, place, and time.  Psychiatric:        Mood and Affect: Mood normal.        Assessment & Plan:  Jujuan Gallia. is a 68 y.o. male . Hyperlipidemia, unspecified hyperlipidemia type  -  Stable, tolerating current regimen, recently had labs with cardiology as above. No changes.   Essential hypertension  - Stable on current regimen with cardiology eval as above.  Continue same.  Situational anxiety  - Some persistent anxiety, although sleep has improved.  We discussed various treatments, but we will continue therapy at this time, follow-up in 2 months and then can decide if role for a medication like an SSRI.  Continued therapy is reasonable for now.  Right first MTP pain, possible underlying bunion, degenerative joint disease, less likely gout with multiple weeks of symptoms.  Discussed prednisone but intolerant with side effects previously.  Trial of meloxicam, check imaging with RTC precautions.  Check uric acid but again unlikely gout.  Check MMR titer with lab visit today, and if nonimmune can provide MMR booster.  Meds ordered this encounter  Medications   meloxicam (MOBIC) 7.5 MG tablet    Sig: Take 1 tablet (7.5 mg total) by mouth daily.    Dispense:  30 tablet    Refill:  0   Patient Instructions  Continue follow up with your therapist. I think it is reasonable to hold on meds for now, but I would like to follow up in 2 months to see how things are going at that time. We can talk about meds or possible meeting with a psychiatrist at your next visit. Hang in there.   I will check a uric acid (gout) test and x-ray for the great toe pain.  Can take meloxicam once per day for the next week or 2 if that is helpful.  Do not combine this with any medications like Advil or Aleve over-the-counter. Depending on x-ray results, I am happy to  refer you to podiatry but lets check some tests as above first.  I will check the lab for measles mumps and rubella immunity test, and if needed we can order a booster.  Recheck in 2 months, let me know if there are questions in meantime.  The lab and x-ray, please proceed to the [place] location below.  Take care.   [facility] or xray: Walk in 8:30-4:30 during weekdays, no appointment needed [address]  [place], [place] [address]     Signed,   Caro Christmas, MD [facility], [facility] 11/11/23 12:53 PM

## 2023-11-11 NOTE — Patient Instructions (Addendum)
 Continue follow up with your therapist. I think it is reasonable to hold on meds for now, but I would like to follow up in 2 months to see how things are going at that time. We can talk about meds or possible meeting with a psychiatrist at your next visit. Hang in there.   I will check a uric acid (gout) test and x-ray for the great toe pain.  Can take meloxicam once per day for the next week or 2 if that is helpful.  Do not combine this with any medications like Advil or Aleve over-the-counter. Depending on x-ray results, I am happy to refer you to podiatry but lets check some tests as above first.  I will check the lab for measles mumps and rubella immunity test, and if needed we can order a booster.  Recheck in 2 months, let me know if there are questions in meantime.  The lab and x-ray, please proceed to the [place] location below.  Take care.    [facility] or xray: Walk in 8:30-4:30 during weekdays, no appointment needed [address]  [place], [place] [address]

## 2023-11-13 LAB — MEASLES/MUMPS/RUBELLA IMMUNITY
Mumps IgG: >300 [AU]/ml
Rubella: <0.9 {index} — ABNORMAL LOW
Rubeola IgG: >300 [AU]/ml

## 2023-11-16 ENCOUNTER — Ambulatory Visit: Payer: Self-pay | Admitting: Family Medicine

## 2023-11-20 ENCOUNTER — Encounter: Payer: Self-pay | Admitting: Family Medicine

## 2023-11-20 NOTE — Telephone Encounter (Signed)
 Patient would prefer a podiatrist in [place] but is okay with Dr.Greene recommending any podiatrist.

## 2023-11-20 NOTE — Telephone Encounter (Signed)
 Patient is looking for a referral for podiatry due to the foot pain getting worse.

## 2023-11-21 ENCOUNTER — Encounter: Payer: Self-pay | Admitting: Family Medicine

## 2023-11-21 DIAGNOSIS — M79674 Pain in right toe(s): Secondary | ICD-10-CM

## 2023-11-21 NOTE — Telephone Encounter (Signed)
 Patient called in to express the urgency to this.

## 2023-11-21 NOTE — Telephone Encounter (Signed)
 Copied from CRM [id number]. Topic: Referral - Question >> Nov 21, 2023  2:58 PM Eric Parks wrote: Reason for CRM: Patient called regarding a referral for Podiatry. He stated there has been ongoing communication through the portal but no referral has been processed yet. Patient does not have a specific clinic or podiatrist in mind and is requesting that the referral be expedited due to worsening foot pain. He is open to being referred to any podiatrist in the [place] area.

## 2023-11-21 NOTE — Addendum Note (Signed)
 Addended by: Rhonda Vangieson R on: 11/21/2023 09:36 PM   Modules accepted: Orders

## 2023-11-21 NOTE — Telephone Encounter (Signed)
 I will place an urgent referral to podiatry.  Unfortunately with his message yesterday I saw the most recent information about the updated MMR and did not get through all the notes at the bottom of that message regarding podiatry referral.  I will send him a message.

## 2023-11-29 ENCOUNTER — Encounter: Payer: Self-pay | Admitting: Podiatry

## 2023-11-29 ENCOUNTER — Ambulatory Visit (INDEPENDENT_AMBULATORY_CARE_PROVIDER_SITE_OTHER)

## 2023-11-29 ENCOUNTER — Ambulatory Visit: Admitting: Podiatry

## 2023-11-29 DIAGNOSIS — M7751 Other enthesopathy of right foot: Secondary | ICD-10-CM | POA: Diagnosis not present

## 2023-11-29 DIAGNOSIS — M19071 Primary osteoarthritis, right ankle and foot: Secondary | ICD-10-CM

## 2023-11-29 MED ORDER — TRIAMCINOLONE ACETONIDE 10 MG/ML IJ SUSP
10.0000 mg | Freq: Once | INTRAMUSCULAR | Status: AC
  Administered 2023-11-29: 10 mg via INTRA_ARTICULAR

## 2023-11-30 NOTE — Progress Notes (Signed)
 Subjective:   Patient ID: Eric Parks., male   DOB: 68 y.o.   MRN: [id number]   HPI Patient states he has developed a lot of pain around his big toe joint right over the last month and does not remember specific injury and states that it makes it hard for him to be active and certainly to walk barefoot.  Patient does not smoke likes to be active   Review of Systems  All other systems reviewed and are negative.       Objective:  Physical Exam Vitals and nursing note reviewed.  Constitutional:      Appearance: He is well-developed.  Pulmonary:     Effort: Pulmonary effort is normal.  Musculoskeletal:        General: Normal range of motion.  Skin:    General: Skin is warm.  Neurological:     Mental Status: He is alert.     Neurovascular status found to be intact muscle strength was found to be adequate range of motion within normal limits.  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  May require orthotics may eventually require shortening osteotomy  X-rays indicate significant elongation first metatarsal segment right moderate elevation very beginnings of spur no indication of joint damage

## 2023-12-05 ENCOUNTER — Encounter: Payer: Self-pay | Admitting: Podiatry

## 2023-12-09 ENCOUNTER — Other Ambulatory Visit: Payer: Self-pay | Admitting: Cardiology

## 2023-12-12 ENCOUNTER — Ambulatory Visit: Admitting: Podiatry

## 2023-12-12 DIAGNOSIS — M19071 Primary osteoarthritis, right ankle and foot: Secondary | ICD-10-CM

## 2023-12-12 DIAGNOSIS — M7751 Other enthesopathy of right foot: Secondary | ICD-10-CM

## 2023-12-12 MED ORDER — TRIAMCINOLONE ACETONIDE 10 MG/ML IJ SUSP
10.0000 mg | Freq: Once | INTRAMUSCULAR | Status: AC
  Administered 2023-12-12: 10 mg via INTRA_ARTICULAR

## 2023-12-13 NOTE — Progress Notes (Signed)
 Subjective:   Patient ID: Eric Gore., male   DOB: 68 y.o.   MRN: [id number]   HPI Patient states the big toe joint right has been bothering the more and it simply did not respond so far to the injection   ROS      Objective:  Physical Exam  Neuro vascular status intact with inflammation fluid and pain around the first MPJ right with diminished range of motion no crepitus of the joint     Assessment:  Inflammatory capsulitis of the first MPJ right with elongated metatarsal bone and hallux limitus more functional than structural     Plan:  H&P reviewed and at this point since the pain is really only been here for 3 or 4 months we are going to try aggressive immobilization.  I applied air fracture walker properly fitted into the lower leg to completely stop the motion of the big toe joint and at the same time did do 1 more periarticular injection 3 mg Dexasone Kenalog  and 5 mg Marcaine.  I explained that if this does not get better we will need to consider shortening osteotomy along with cleaning up of some dorsal spur formation.  Reviewed that procedure and will reevaluate again in 4 weeks after we immobilized the toe

## 2024-01-10 ENCOUNTER — Ambulatory Visit: Admitting: Podiatry

## 2024-01-13 ENCOUNTER — Ambulatory Visit: Admitting: Family Medicine

## 2024-02-19 ENCOUNTER — Ambulatory Visit (INDEPENDENT_AMBULATORY_CARE_PROVIDER_SITE_OTHER): Admitting: Family Medicine

## 2024-02-19 VITALS — BP 122/62 | HR 73 | Temp 98.0°F | Ht 68.0 in | Wt 158.6 lb

## 2024-02-19 DIAGNOSIS — F4321 Adjustment disorder with depressed mood: Secondary | ICD-10-CM

## 2024-02-19 DIAGNOSIS — E785 Hyperlipidemia, unspecified: Secondary | ICD-10-CM

## 2024-02-19 DIAGNOSIS — R079 Chest pain, unspecified: Secondary | ICD-10-CM | POA: Diagnosis not present

## 2024-02-19 DIAGNOSIS — F418 Other specified anxiety disorders: Secondary | ICD-10-CM

## 2024-02-19 DIAGNOSIS — I10 Essential (primary) hypertension: Secondary | ICD-10-CM

## 2024-02-19 DIAGNOSIS — M79601 Pain in right arm: Secondary | ICD-10-CM

## 2024-02-19 NOTE — Progress Notes (Unsigned)
 Subjective:  Patient ID: Eric LELON Vicci Mickey., male    DOB: 1956-06-17  Age: 68 y.o. MRN: [id number]  CC:  Chief Complaint  Patient presents with   Medical Management of Chronic Issues    Discuss sleep and other sxs, pt notes sleep has been better, notes no other concerns    Muscle Pain    Pt notes has started going to the gym and thinks he pulled a muscle in his Rt bicep, apx 5-6 weeks ago    Depression    Pt notes doing about the same     HPI Eric Parks. presents for   Anxiety/insomnia Last discussed in May.  Meeting with therapist once per week at that time and was sleeping better. Decided against new medications at that time.  Continue to meet with therapist. Still declines new meds at this time. Feels like he is at appropriate stage given situation. Moving through his loss. Therapist visit once per week - but as of last week - plans to take a break for awhile. Has participated at grief groups at [facility] and [facility]. Would like to meet with a psychiatrist to discuss options, but still would like to hold on meds for now.      02/19/2024    2:49 PM 11/11/2023   11:47 AM 07/24/2023    1:47 PM 07/09/2023   10:17 AM 05/13/2023    1:28 PM  Depression screen PHQ 2/9  Decreased Interest 1 1 3  0 0  Down, Depressed, Hopeless 1 1 3  0 0  PHQ - 2 Score 2 2 6  0 0  Altered sleeping 0 0 3 0 0  Tired, decreased energy 1 1 3  0 0  Change in appetite 0 0 3 0 0  Feeling bad or failure about yourself  0 0 3 0 0  Trouble concentrating 0 0 3 0 0  Moving slowly or fidgety/restless 0 0 0 0 0  Suicidal thoughts 0 0 0 0 0  PHQ-9 Score 3 3 21  0 0  Difficult doing work/chores Not difficult at all  Not difficult at all        02/19/2024    2:50 PM 11/11/2023   11:48 AM 07/24/2023    1:48 PM 05/13/2023    1:28 PM  GAD 7 : Generalized Anxiety Score  Nervous, Anxious, on Edge 1 1 3 1   Control/stop worrying 0 1 3 1   Worry too much - different things 1 1 3 1   Trouble  relaxing 0 1 3 0  Restless 0 0 0 0  Easily annoyed or irritable 0 0 0 0  Afraid - awful might happen 0 0 0 1  Total GAD 7 Score 2 4 12 4   Anxiety Difficulty Not difficult at all  Not difficult at all      Right arm pain Right upper arm pain.  6 weeks ago - had joined a gym. Upper body exercises. No pop/injury known.  Sore next day.  Upper tricep area. Still sore, but improving.  No swelling/bruising, or change in appearance.  Tx: none.   Hypertension: Olmesartan  20 mg daily.  No new side effects.  Home readings: 120/80 or lower.  Chest pain - intermittent left upper chest. Past few months. Intermittent. Few times per week. Lasts few seconds, possibly one episode where he felt in left arm, but not other times.  No associated Watching tv, not with exertion. No CP/DOE.  Has cardiologist - appt in March.  No current chest pain.  No hx of MI, PTCA.  BP Readings from Last 3 Encounters:  02/19/24 122/62  11/11/23 118/62  09/24/23 (!) 110/56   Lab Results  Component Value Date   CREATININE 0.78 09/16/2023   Hyperlipidemia: Crestor 10 mg daily, no new myalgias. No new side effects. Not fasting today.  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   Essential hypertension 01/22/2019   Hyperlipidemia 01/22/2019   PSA elevation 01/19/2016   Rising PSA level 01/10/2016   Postlaminectomy syndrome, lumbar region 05/13/2012   Spinal stenosis 08/10/2011   Lumbar pain with radiation down left leg 01/26/2011   OA (osteoarthritis of the spine) 01/26/2011   Past Medical History:  Diagnosis Date   AIN grade II    Anxiety     Depression    reactive   History of chicken pox    Hyperlipidemia    Hypertension    PSA elevation    Spinal stenosis    Past Surgical History:  Procedure Laterality Date   BACK SURGERY     foraminotomy   COLONOSCOPY     HERNIA REPAIR     RECTAL BIOPSY N/A    TONSILLECTOMY  1977   No Known Allergies Prior to Admission medications   Medication Sig Start Date End Date Taking? Authorizing Provider  Ascorbic Acid (VITAMIN C PO) Take 1,000 mg by mouth.   Yes [provider]  BIOTIN PO Take 5,000 mcg by mouth.   Yes [provider]  clorazepate  (TRANXENE ) 15 MG tablet Take 0.5-1 tablets (7.5-15 mg total) by mouth 2 (two) times daily as needed for anxiety or sleep. 07/19/23  Yes Levora Reyes SAUNDERS, MD  Coenzyme Q10 10 MG capsule Take 1 capsule by mouth daily.   Yes [provider]  Glucosamine-Chondroit-Vit C-Mn (GLUCOSAMINE 1500 COMPLEX PO) Take by mouth.   Yes [provider]  Lutein 20 MG CAPS Take 1 tablet by mouth daily.   Yes [provider]  meloxicam  (MOBIC ) 7.5 MG tablet Take 1 tablet (7.5 mg total) by mouth daily. 11/11/23  Yes Levora Reyes SAUNDERS, MD  minoxidil (LONITEN) 2.5 MG tablet Take 5 mg by mouth daily. 09/09/21  Yes [provider]  Multiple Vitamin (MULTIVITAMIN) tablet Take 1 tablet by mouth daily.   Yes [provider]  olmesartan  (BENICAR ) 20 MG tablet TAKE 1 TABLET DAILY 12/10/23  Yes Ganji, Jay, MD  rosuvastatin (CRESTOR) 10 MG tablet TAKE 1 TABLET DAILY 06/14/23  Yes Ganji, Jay, MD  tretinoin (RETIN-A) 0.1 % cream 3-4 x week 12/18/18  Yes [provider]  TURMERIC PO Take 1 capsule by mouth daily at 2 PM.   Yes [provider]   Social History   Socioeconomic History   Marital status: Widowed    Spouse name: Not on file   Number of children: 0   Years of education: Not on file   Highest education level: Master's degree (e.g., MA, MS, MEng, MEd, MSW, MBA)  Occupational History    Occupation: Clinical research associate  Tobacco Use   Smoking status: Never   Smokeless tobacco: Never  Vaping Use   Vaping status: Never Used  Substance and Sexual Activity   Alcohol use: Yes    Alcohol/week: 7.0 standard drinks of alcohol    Types: 7 Standard drinks or equivalent per week    Comment: wine nightly   Drug use: Never   Sexual activity: Yes  Other Topics Concern   Not on file  Social History Narrative   Not on file   Social Drivers of Health   Financial Resource Strain: Low Risk  (02/17/2024)   Overall Financial Resource Strain (CARDIA)    Difficulty of Paying Living Expenses: Not hard at all  Food Insecurity: No Food Insecurity (02/17/2024)   Hunger Vital Sign    Worried About Running Out of Food in the Last Year: Never true    Ran Out of Food in the Last Year: Never true  Transportation Needs: No Transportation Needs (02/17/2024)   PRAPARE - Administrator, Civil Service (Medical): No    Lack of Transportation (Non-Medical): No  Physical Activity: Sufficiently Active (02/17/2024)   Exercise Vital Sign    Days of Exercise per Week: 5 days    Minutes of Exercise per Session: 60 min  Stress: No Stress Concern Present (02/17/2024)   [employer] of Occupational Health - Occupational Stress Questionnaire    Feeling of Stress: Only a little  Social Connections: Moderately Integrated (02/17/2024)   Social Connection and Isolation Panel    Frequency of Communication with Friends and Family: More than three times a week    Frequency of Social Gatherings with Friends and Family: Twice a week    Attends Religious Services: More than 4 times per year    Active Member of [employer] or Organizations: Yes    Attends Banker Meetings: More than 4 times per year    Marital Status: Widowed  Intimate Partner Violence: Not At Risk (07/09/2023)   Humiliation, Afraid, Rape, and Kick questionnaire    Fear of Current or Ex-Partner: No    Emotionally Abused: No    Physically  Abused: No    Sexually Abused: No    Review of Systems Per HPI above.  Objective:   Vitals:   02/19/24 1448  BP: 122/62  Pulse: 73  Temp: 98 F (36.7 C)  TempSrc: Temporal  SpO2: 97%  Weight: 158 lb 9.6 oz (71.9 kg)  Height: 5' 8 (1.727 m)     Physical Exam Vitals reviewed.  Constitutional:      Appearance: He is well-developed.  HENT:     Head: Normocephalic and atraumatic.  Neck:     Vascular: No carotid bruit or JVD.  Cardiovascular:     Rate and Rhythm: Normal rate and regular rhythm.     Heart sounds: Normal heart sounds. No murmur heard. Pulmonary:     Effort: Pulmonary effort is normal.     Breath sounds: Normal breath sounds. No rales.  Musculoskeletal:     Right lower leg: No edema.     Left lower leg: No edema.     Comments: Right arm, no bony tenderness.  No defect noted on triceps or change in appearance.  No focal tenderness along triceps but reports this is the area of discomfort.  Full range of motion at the elbow.  No skin changes, no ecchymosis.  Skin:    General: Skin is warm and dry.  Neurological:     Mental Status: He  is alert and oriented to person, place, and time.  Psychiatric:        Mood and Affect: Mood normal.    EKG, sinus rhythm, rate 66, PR 148, QTc 467.  Right bundle branch block, left axis deviation.  Some baseline artifact.  Compared to EKG on 09/24/2023, Bifascicular block noted at that time.  Right bundle branch block.  No significant changes appreciated  Assessment & Plan:  Eric Parks. is a 68 y.o. male . Anxiety with depression - Plan: Ambulatory referral to Psychiatry Grief - Plan: Ambulatory referral to Psychiatry  - Overall stable symptoms, status post therapy as above, feels like he is stabilized with current techniques of therapy, plan for break from therapy for now.  We have discussed medications and potential risks, benefits previously, but ultimately those have been declined.  He would like to meet with  psychiatry to discuss treatment further, referral placed.  Right arm pain  - Suspected triceps strain, no defect, strength intact.  Improving.  Continue activity modification as improves, then start low, go slow approach when he does return to exercise.  RTC precautions.  Hyperlipidemia, unspecified hyperlipidemia type - Plan: Comprehensive metabolic panel with GFR, Lipid panel  - Labs and adjust plan accordingly.  Essential hypertension - Plan: Comprehensive metabolic panel with GFR  - Stable, no med changes at this time.  Chest pain, unspecified type - Plan: EKG 12-Lead  - Infrequent atypical chest pain.  Did note 1 episode of possible discomfort into his left arm.  Asymptomatic in office.  He has seen cardiology previously, asked that he call his cardiologist to advise of the symptoms and to be seen to determine if other testing needed with ER precautions given.  Will forward note to cardiology as well.  No orders of the defined types were placed in this encounter.  There are no Patient Instructions on file for this visit.    Signed,   Reyes Pines, MD [facility], [facility] 02/19/24 3:30 PM

## 2024-02-19 NOTE — Patient Instructions (Addendum)
 I did place a referral to psychiatry to discuss medications if needed.  Follow-up with your therapist if needed in the interim.  Let me know if there are questions.  Right arm pain could have been an overuse of the tricep muscle or a strain.  As long as it is improving, can start low, go slow with return of exercise with arms.  Follow-up if that pain does not continue to improve or worsens.  Although atypical I would like you to meet with your cardiologist regarding the intermittent chest pains.  Avoid any exertional exercise for now until that has been discussed with cardiology.  If any concerns on labs I will let you know but no med changes at this time.   Nonspecific Chest Pain, Adult Chest pain is an uncomfortable, tight, or painful feeling in the chest. The pain can feel like a crushing, aching, or squeezing pressure. A person can feel a burning or tingling sensation. Chest pain can also be felt in your back, neck, jaw, shoulder, or arm. This pain can be worse when you move, sneeze, or take a deep breath. Chest pain can be caused by a condition that is life-threatening. This must be treated right away. It can also be caused by something that is not life-threatening. If you have chest pain, it can be hard to know the difference, so it is important to get help right away to make sure that you do not have a serious condition. Some life-threatening causes of chest pain include: Heart attack. A tear in the body's main blood vessel (aortic dissection). Inflammation around your heart (pericarditis). A problem in the lungs, such as a blood clot (pulmonary embolism) or a collapsed lung (pneumothorax). Some non life-threatening causes of chest pain include: Heartburn. Anxiety or stress. Damage to the bones, muscles, and cartilage that make up your chest wall. Pneumonia or bronchitis. Shingles infection (varicella-zoster virus). Your chest pain may come and go. It may also be constant. Your health  care provider will do tests and other studies to find the cause of your pain. Treatment will depend on the cause of your chest pain. Follow these instructions at home: Medicines Take over-the-counter and prescription medicines only as told by your health care provider. If you were prescribed an antibiotic medicine, take it as told by your health care provider. Do not stop taking the antibiotic even if you start to feel better. Activity Avoid any activities that cause chest pain. Do not lift anything that is heavier than 10 lb (4.5 kg), or the limit that you are told, until your health care provider says that it is safe. Rest as directed by your health care provider. Return to your normal activities only as told by your health care provider. Ask your health care provider what activities are safe for you. Lifestyle     Do not use any products that contain nicotine or tobacco, such as cigarettes, e-cigarettes, and chewing tobacco. If you need help quitting, ask your health care provider. Do not drink alcohol. Make healthy lifestyle changes as recommended. These may include: Getting regular exercise. Ask your health care provider to suggest some exercises that are safe for you. Eating a heart-healthy diet. This includes plenty of fresh fruits and vegetables, whole grains, low-fat (lean) protein, and low-fat dairy products. A dietitian can help you find healthy eating options. Maintaining a healthy weight. Managing any other health conditions you may have, such as high blood pressure (hypertension) or diabetes. Reducing stress, such as with yoga  or relaxation techniques. General instructions Pay attention to any changes in your symptoms. It is up to you to get the results of any tests that were done. Ask your health care provider, or the department that is doing the tests, when your results will be ready. Keep all follow-up visits as told by your health care provider. This is important. You may  be asked to go for further testing if your chest pain does not go away. Contact a health care provider if: Your chest pain does not go away. You feel depressed. You have a fever. You notice changes in your symptoms or develop new symptoms. Get help right away if: Your chest pain gets worse. You have a cough that gets worse, or you cough up blood. You have severe pain in your abdomen. You faint. You have sudden, unexplained chest discomfort. You have sudden, unexplained discomfort in your arms, back, neck, or jaw. You have shortness of breath at any time. You suddenly start to sweat, or your skin gets clammy. You feel nausea or you vomit. You suddenly feel lightheaded or dizzy. You have severe weakness, or unexplained weakness or fatigue. Your heart begins to beat quickly, or it feels like it is skipping beats. These symptoms may represent a serious problem that is an emergency. Do not wait to see if the symptoms will go away. Get medical help right away. Call your local emergency services (911 in the [place] Do not drive yourself to the hospital. Summary Chest pain can be caused by a condition that is serious and requires urgent treatment. It may also be caused by something that is not life-threatening. Your health care provider may do lab tests and other studies to find the cause of your pain. Follow your health care provider's instructions on taking medicines, making lifestyle changes, and getting emergency treatment if symptoms become worse. Keep all follow-up visits as told by your health care provider. This includes visits for any further testing if your chest pain does not go away. This information is not intended to replace advice given to you by your health care provider. Make sure you discuss any questions you have with your health care provider. Document Revised: 05/03/2022 Document Reviewed: 05/03/2022 Elsevier Patient Education  2024 [employer].

## 2024-02-20 ENCOUNTER — Encounter: Payer: Self-pay | Admitting: Family Medicine

## 2024-02-20 ENCOUNTER — Encounter: Payer: Self-pay | Admitting: Cardiology

## 2024-02-20 LAB — COMPREHENSIVE METABOLIC PANEL WITH GFR
ALT: 13 U/L (ref 0–53)
AST: 21 U/L (ref 0–37)
Albumin: 4.6 g/dL (ref 3.5–5.2)
Alkaline Phosphatase: 48 U/L (ref 39–117)
BUN: 19 mg/dL (ref 6–23)
CO2: 27 meq/L (ref 19–32)
Calcium: 9.5 mg/dL (ref 8.4–10.5)
Chloride: 106 meq/L (ref 96–112)
Creatinine, Ser: 0.82 mg/dL (ref 0.40–1.50)
GFR: 89.42 mL/min (ref >60.00)
Glucose, Bld: 95 mg/dL (ref 70–99)
Potassium: 5.1 meq/L (ref 3.5–5.1)
Sodium: 140 meq/L (ref 135–145)
Total Bilirubin: 0.6 mg/dL (ref 0.2–1.2)
Total Protein: 7.2 g/dL (ref 6.0–8.3)

## 2024-02-20 LAB — LIPID PANEL
Cholesterol: 131 mg/dL (ref 0–200)
HDL: 76.1 mg/dL (ref >39.00)
LDL Cholesterol: 43 mg/dL (ref 0–99)
NonHDL: 54.55
Total CHOL/HDL Ratio: 2
Triglycerides: 58 mg/dL (ref 0.0–149.0)
VLDL: 11.6 mg/dL (ref 0.0–40.0)

## 2024-02-21 ENCOUNTER — Encounter: Payer: Self-pay | Admitting: Cardiology

## 2024-02-21 ENCOUNTER — Ambulatory Visit: Attending: Cardiology | Admitting: Cardiology

## 2024-02-21 VITALS — BP 113/72 | HR 70 | Resp 16 | Ht 68.0 in | Wt 152.8 lb

## 2024-02-21 DIAGNOSIS — E7800 Pure hypercholesterolemia, unspecified: Secondary | ICD-10-CM | POA: Insufficient documentation

## 2024-02-21 DIAGNOSIS — R0789 Other chest pain: Secondary | ICD-10-CM | POA: Diagnosis present

## 2024-02-21 DIAGNOSIS — I10 Essential (primary) hypertension: Secondary | ICD-10-CM | POA: Insufficient documentation

## 2024-02-21 DIAGNOSIS — I351 Nonrheumatic aortic (valve) insufficiency: Secondary | ICD-10-CM | POA: Diagnosis present

## 2024-02-21 NOTE — Patient Instructions (Signed)

## 2024-02-21 NOTE — Progress Notes (Signed)
 Cardiology Office Note:  .   Date:  02/21/2024  ID:  Eric LELON Vicci Mickey., DOB [birth year], MRN [id number] PCP: Levora Reyes SAUNDERS, MD  [facility] Providers Cardiologist:  Gordy Bergamo, MD   History of Present Illness: .   Eric Carmen. is a 68 y.o. Caucasian male with hypertension and mild hyperlipidemia, abnormal nuclear stress test in 2018 revealing mild anterior ischemia, echocardiogram revealing mild to moderate aortic regurgitation with LVEF 45 to 50% in 2018, LVEF had normalized by medical therapy and treatment of hypertension.   He was referred back to me for evaluation of chest pain that occurred a week ago x 2 when he was laying down that lasted a few minutes.  He has continued to exercise and exert himself without any exertional chest pain or dyspnea otherwise remains asymptomatic.  He lost his husband 7 months ago unfortunately under unacceptable circumstances and he is presently grieving as well.  He is also worried about his overall health.  He will return 68 years of age in 2 months.  Cardiac Studies relevent.    Coronary calcium score 05/16/2022: 1.  Total calcium score of 25. This places the patient in the 30th percentile for age and race based on the [facility] data base.  2.  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 Dilated aortic annulus.   ECHOCARDIOGRAM COMPLETE 09/13/2023  1. Left ventricular ejection fraction, by estimation, is 60 to 65%. The left ventricle has normal function. The left ventricle has no regional wall motion abnormalities. Left ventricular diastolic parameters were normal. The average left ventricular global longitudinal strain is -19.9 %. The global longitudinal strain is normal. 2. The aortic valve is tricuspid. Aortic valve regurgitation is mild. No aortic stenosis is present. 5. Aortic dilatation noted. There is mild dilatation of the ascending aorta, measuring 40 mm.    Discussed the use of AI scribe software  for clinical note transcription with the patient, who gave verbal consent to proceed.  History of Present Illness He was referred by Dr. Chyrl Seip for evaluation of chest pain.  He experiences intermittent chest pain localized around his heart, primarily occurring at night while lying down and watching TV. The pain is not sharp but concerning. An episode a week ago included discomfort in his left arm, causing anxiety. He does not experience chest pain during physical activities such as walking or swimming.  He remains physically active, walking approximately four miles for about an hour without experiencing pain. His last swim was six weeks ago, and his last walk was two days ago, both without issues aside from feeling hot and sweaty.  He is experiencing significant stress and grief, which he believes may contribute to his symptoms. He feels tired due to sadness and stress, describing this period as difficult. He is not taking antidepressants and is reluctant to do so due to past experiences with his partner.   Labs   Lab Results  Component Value Date   CHOL 131 02/19/2024   HDL 76.10 02/19/2024   LDLCALC 43 02/19/2024   LDLDIRECT 51 09/10/2019   TRIG 58.0 02/19/2024   CHOLHDL 2 02/19/2024   Lipoprotein (a)  Date/Time Value Ref Range Status  09/10/2019 10:38 AM <8.4 <75.0 nmol/L Final    Comment:    **Results verified by repeat testing** Note:  Values greater than or equal to 75.0 nmol/L may        indicate an independent risk factor for CHD,  but must be evaluated with caution when applied        to non-Caucasian populations due to the        influence of genetic factors on Lp(a) across        ethnicities.     Recent Labs    05/13/23 1423 09/16/23 0944 02/19/24 1617  NA 138 142 140  K 5.0 4.6 5.1  CL 106 107 106  CO2 27 28 27   GLUCOSE 93 85 95  BUN 14 14 19   CREATININE 0.79 0.78 0.82  CALCIUM 9.6 9.6 9.5    Lab Results  Component Value Date   ALT 13  02/19/2024   AST 21 02/19/2024   ALKPHOS 48 02/19/2024   BILITOT 0.6 02/19/2024      Latest Ref Rng & Units 09/18/2017   12:33 PM 07/15/2012   10:53 AM 01/26/2011   11:12 AM  CBC  WBC 4.0 - 10.5 K/uL 6.0  5.1  5.3   Hemoglobin 13.0 - 17.0 g/dL 85.5  85.7  84.9   Hematocrit 39.0 - 52.0 % 42.2  42.0  44.2   Platelets 150 - 400 K/uL 144  156.0  152.0    No results found for: HGBA1C  Lab Results  Component Value Date   TSH 1.08 07/15/2012     ROS  Review of Systems  Cardiovascular:  Negative for chest pain, dyspnea on exertion and leg swelling.   Physical Exam:   VS:  BP 113/72 (BP Location: Left Arm, Patient Position: Sitting, Cuff Size: Normal)   Pulse 70   Resp 16   Ht 5' 8 (1.727 m)   Wt 152 lb 12.8 oz (69.3 kg)   SpO2 95%   BMI 23.23 kg/m    Wt Readings from Last 3 Encounters:  02/21/24 152 lb 12.8 oz (69.3 kg)  02/19/24 158 lb 9.6 oz (71.9 kg)  11/11/23 161 lb 3.2 oz (73.1 kg)    BP Readings from Last 3 Encounters:  02/21/24 113/72  02/19/24 122/62  11/11/23 118/62   Physical Exam Neck:     Vascular: No JVD.  Cardiovascular:     Rate and Rhythm: Normal rate and regular rhythm.     Pulses: Intact distal pulses.     Heart sounds: S1 normal and S2 normal. Murmur heard.     Systolic murmur is present.     Blowing early diastolic murmur is present with a grade of 2/4 at the upper left sternal border radiating to the apex.     No gallop.  Pulmonary:     Effort: Pulmonary effort is normal.     Breath sounds: Normal breath sounds.  Abdominal:     General: Bowel sounds are normal.     Palpations: Abdomen is soft.  Musculoskeletal:     Right lower leg: No edema.     Left lower leg: No edema.   EKG:       EKG 02/20/2024: Normal sinus rhythm at the rate of 66 bpm, left anterior fascicular block.  Right bundle branch block.  Compared to 09/24/2023, no change.  ASSESSMENT AND PLAN: .      ICD-10-CM   1. Non-cardiac chest pain  R07.89     2. Primary  hypertension  I10     3. Pure hypercholesterolemia  E78.00     4. Nonrheumatic aortic valve insufficiency  I35.1      Assessment & Plan Intermittent non-cardiac chest pain Intermittent chest pain occurs primarily at night while lying down, not  associated with exertion. Pain is non-cardiac, possibly related to stress or acid reflux. EKG and physical exam are normal, and labs show well-controlled cholesterol and blood pressure. No evidence of heart-related issues due to effective medication management. Low likelihood of a major cardiac event. - Reassure that pain is non-cardiac. - Encourage continuation of current medications for blood pressure and cholesterol. - Advise to monitor symptoms and return if he persists or worsens. - No further cardiac testing recommended.  Mild aortic valve regurgitation Mild aortic valve regurgitation, well-tolerated by the heart, with no significant changes over the past four to five years.  Hyperlipidemia and hypertension, well controlled Hyperlipidemia and hypertension are well controlled with current medication regimen. Blood pressure is 113/72 mmHg, LDL is 43 mg/dL, and HDL is 76 mg/dL. Current medications include rosuvastatin and omisartan, effectively managing risk factors. Reduced risk of a major cardiac event due to medication. - Continue current medications: rosuvastatin 10 mg once daily and omisartan 20 mg once daily. - Encourage regular physical activity and healthy lifestyle.  Grief reaction Experiencing significant grief and emotional stress following a personal loss. Symptoms include sadness, tiredness, and intermittent anxiety. Reluctant to use antidepressants due to past experiences with a loved one. Engaging in social activities and educational pursuits as coping mechanisms. - Encourage social engagement and activities, such as lunch with friends and auditing classes. - Suggest travel for a change of environment. - Discuss potential benefits  of antidepressants, but respect decision to avoid them. -    Follow up: See me as needed, if change in murmur or any symptoms of heart failure or symptoms suggestive of angina pectoris.  Signed,  Gordy Bergamo, MD, [facility] 02/21/2024, 11:33 AM [facility] [address] [place], [place] [address] Phone: [phone]. Fax:  [phone]

## 2024-02-25 ENCOUNTER — Ambulatory Visit: Payer: Self-pay | Admitting: Family Medicine

## 2024-03-16 NOTE — Progress Notes (Signed)
 Psychiatric Initial Adult Assessment   Patient Identification: Eric Parks. MRN:  [id number] Date of Evaluation:  03/30/2024 Referral Source: Primary care provider Chief Complaint:   Chief Complaint  Patient presents with   Establish Care   Grief reaction   Visit Diagnosis:    ICD-10-CM   1. Grief reaction  F43.20        Assessment:  Eric Parks. is a 68 y.o. male with a history of MDD, GAD, insomnia who presents in person to [facility] for initial evaluation on 03/30/2024.    At initial evaluation patient reports feeling depressed and anxious has a grief reaction to losing his husband in January of this year.  He has been feeling guilty with low energy throughout the day but is not actively or passively suicidal or homicidal.  His grief reaction has improved over the past few months and he has benefited well with therapy.  He has been eating and sleeping well, going out with his friends, working out, involved in church.  He does not have any panic attacks and most of his symptoms are situated around the stressful event.  He is reluctant to try any medications, reserved due to his partner's experience and likely experiencing side effects in the past which she could not describe well in detail about the medication.  He did not seem to be having any symptoms of mania, he has not been using any substances including alcohol or cigarettes.  We discussed different medications in detail however the patient was not amenable to trying any psychotropic medications.  [facility], had a depressed in the past which he discontinued recently.  He wanted resources for therapy, provided him with reaching out to [facility] and here in the clinic, patient was more receptive to it.  Encouraged him to reach out to the clinic if he needed medication management services in the future.  Patient currently  not does not meet the criteria of involuntary commitment and would benefit from longitudinal therapy.   A number of assessments were performed during the evaluation today including  PHQ-9 which they scored a 3 on, GAD-7 which they scored a 3 on, and [place] suicide severity screening which showed no risk.    Risk Assessment: A suicide and violence risk assessment was performed as part of this evaluation. There patient is deemed to be at chronic elevated risk for self-harm/suicide given the following factors: sense of isolation, unwillingness to seek help, history of depression, and recent loss. These risk factors are mitigated by the following factors: lack of active SI/HI, no known access to weapons or firearms, no history of previous suicide attempts, and no history of violence. The patient is deemed to be at chronic elevated risk for violence given the following factors: N/A. These risk factors are mitigated by the following factors: no known history of violence towards others, no known violence towards others in the last 6 months, no known history of threats of harm towards others, no known homicidal ideation in the last 6 months, no command hallucinations to harm others in the last 6 months, no active symptoms of psychosis, and no active symptoms of mania. There is no acute risk for suicide or violence at this time. The patient was educated about relevant modifiable risk factors including following recommendations for treatment of psychiatric illness and abstaining from substance abuse.  While future psychiatric events cannot be accurately predicted, the patient  does not currently require  acute inpatient psychiatric care and does not currently meet [place]  involuntary commitment criteria.  Patient was given contact information for crisis resources, behavioral health clinic and was instructed to call 911 for emergencies.    Plan: # Grief reaction Past medication trials: Zolpidem , diazepam  5  mg Status of problem: Current Interventions: -- Continue psychotherapy/group therapy  # GAD Past medication trials: Diazepam  5 mg, clorazepate  15 mg Status of problem: Current Interventions: -- Continue psychotherapy/group therapy   History of Present Illness:   [name] is a 68 year old male with a remote history of MDD that presented to the clinic today to establish care.  He reported that my husband of 37 years killed himself in January of this year , reported that symptoms started then and have been going downhill.  He stated that he was seeing a therapist and his PCP discussed about being on an antidepressant for which he presented today to the clinic.  Reported that his husband was dealing with mental health issues and underwent TMS and had been on multiple medication in the past for which he was reluctant to try any other medications today.  He stated that I have been explained my reservation, he was on cocktail, antipsychotics, antidepressants, sleep medicines nothing helped him.  He reported that he was previously in therapy but he stopped since he is doing [facility] , every Tuesday night and did not feel like he needed another therapy.  Stated that he was mostly having monotonous, repetitive therapy sessions which made him reluctant.  He is reported that he has also completed group therapy for 4-week called [facility] and [place]. He reported feeling depressed, grief reaction in response to losing his husband stating my energy is not great, I feel guilty that I did not response to a severely of problem, I did not be patient.  He reported that he has been going to the gym over the past few months, exercising, meeting his friends and denied any active or passive SI/HI/AVH.  He reported good sleep, sleeps around 12 AM and wakes up around 8 to 9:30 AM, denied any frequent awakenings or any nightmares or flashbacks.  Stated that he has been doing okay  with sleep.  When asked about appetite he stated fine .  When asked about anxiety he stated not severe, little OCD .  Stated that he has fear if someone will break into his house, I tried to check locks multiple times, denied any panic attacks. Reported that he had been on antidepressants in the past had insomnia and sexual side effects and did not want to take any medicines currently.  When asked about how his depression is as compared to few months prior he stated slightly better . We discussed different medications however patient was reluctant to try any stated that he would like to seek therapy services.  He stated that he was enrolled in [facility] and did not seem that it was beneficial.  We provided him [facility], also referred him to therapy in the clinic.  Encouraged patient to reach out in case he needed medication management in the future.  Associated Signs/Symptoms: Depression Symptoms:  depressed mood, feelings of worthlessness/guilt, (Hypo) Manic Symptoms:  None Anxiety Symptoms:  Excessive Worry, Psychotic Symptoms:  None PTSD Symptoms: Negative  Past Psychiatric History:  Past psychiatric diagnoses: Insomnia, MDD, GAD Psychiatric hospitalizations: Denies Past suicide attempts: Denies Hx of self harm: Denies  Hx of violence towards others: Denies Prior psychiatric providers: None Prior therapy: Psychotherapy every week Access to firearms: Denies  Prior medication trials: Diazepam , clorazepate , zolpidem   Substance use: Denies  Past Medical History:  Past Medical History:  Diagnosis Date   AIN grade II    Anxiety    Depression    reactive   History of chicken pox    Hyperlipidemia    Hypertension    PSA elevation    Spinal stenosis     Past Surgical History:  Procedure Laterality Date   BACK SURGERY     foraminotomy   COLONOSCOPY     HERNIA REPAIR     RECTAL BIOPSY N/A    TONSILLECTOMY  1977     Family Psychiatric History: Nothing significant  Family History:  Family History  Problem Relation Age of Onset   Arthritis Father    Hyperlipidemia Father    Heart disease Father    Hypertension Father    Diabetes Father    Arthritis Mother    Colon cancer Mother 73   Stomach cancer Maternal Aunt        stomach cancer or ovarian   Melanoma Maternal Grandfather    Pancreatic cancer Neg Hx    Rectal cancer Neg Hx    Colon polyps Neg Hx    Esophageal cancer Neg Hx     Social History:   Social History   Socioeconomic History   Marital status: Widowed    Spouse name: Not on file   Number of children: 0   Years of education: Not on file   Highest education level: Master's degree (e.g., MA, MS, MEng, MEd, MSW, MBA)  Occupational History   Occupation: Clinical research associate  Tobacco Use   Smoking status: Never   Smokeless tobacco: Never  Vaping Use   Vaping status: Never Used  Substance and Sexual Activity   Alcohol use: Yes    Alcohol/week: 7.0 standard drinks of alcohol    Types: 7 Standard drinks or equivalent per week    Comment: wine nightly   Drug use: Never   Sexual activity: Yes  Other Topics Concern   Not on file  Social History Narrative   Not on file   Social Drivers of Health   Financial Resource Strain: Low Risk  (02/17/2024)   Overall Financial Resource Strain (CARDIA)    Difficulty of Paying Living Expenses: Not hard at all  Food Insecurity: No Food Insecurity (02/17/2024)   Hunger Vital Sign    Worried About Running Out of Food in the Last Year: Never true    Ran Out of Food in the Last Year: Never true  Transportation Needs: No Transportation Needs (02/17/2024)   PRAPARE - Administrator, Civil Service (Medical): No    Lack of Transportation (Non-Medical): No  Physical Activity: Sufficiently Active (02/17/2024)   Exercise Vital Sign    Days of Exercise per Week: 5 days    Minutes of Exercise per Session: 60 min  Stress: No Stress Concern  Present (02/17/2024)   [employer] of Occupational Health - Occupational Stress Questionnaire    Feeling of Stress: Only a little  Social Connections: Moderately Integrated (02/17/2024)   Social Connection and Isolation Panel    Frequency of Communication with Friends and Family: More than three times a week    Frequency of Social Gatherings with Friends and Family: Twice a week    Attends Religious Services: More than 4 times per year    Active  Member of Clubs or Organizations: Yes    Attends Engineer, structural: More than 4 times per year    Marital Status: Widowed    Additional Social History: Patient is currently retired, stays home, goes out with friends and is trying to involve in workout every day.  Allergies:  No Known Allergies  Metabolic Disorder Labs: No results found for: HGBA1C, MPG No results found for: PROLACTIN Lab Results  Component Value Date   CHOL 131 02/19/2024   TRIG 58.0 02/19/2024   HDL 76.10 02/19/2024   CHOLHDL 2 02/19/2024   VLDL 11.6 02/19/2024   LDLCALC 43 02/19/2024   LDLCALC 54 09/16/2023   Lab Results  Component Value Date   TSH 1.08 07/15/2012    Therapeutic Level Labs: No results found for: LITHIUM No results found for: CBMZ No results found for: VALPROATE  Current Medications: Current Outpatient Medications  Medication Sig Dispense Refill   Ascorbic Acid (VITAMIN C PO) Take 1,000 mg by mouth.     BIOTIN PO Take 5,000 mcg by mouth.     clorazepate  (TRANXENE ) 15 MG tablet Take 0.5-1 tablets (7.5-15 mg total) by mouth 2 (two) times daily as needed for anxiety or sleep. 20 tablet 0   Lutein 20 MG CAPS Take 1 tablet by mouth daily.     minoxidil (LONITEN) 2.5 MG tablet Take 5 mg by mouth daily.     Multiple Vitamin (MULTIVITAMIN) tablet Take 1 tablet by mouth daily.     olmesartan  (BENICAR ) 20 MG tablet TAKE 1 TABLET DAILY 90 tablet 3   rosuvastatin (CRESTOR) 10 MG tablet TAKE 1 TABLET DAILY 90 tablet 3    TURMERIC PO Take 1 capsule by mouth daily at 2 PM.     No current facility-administered medications for this visit.    Musculoskeletal: Strength & Muscle Tone: within normal limits Gait & Station: normal Patient leans: N/A  Psychiatric Specialty Exam:  Psychiatric Specialty Exam: Blood pressure 113/77, pulse 76, height 5' 8 (1.727 m), weight 162 lb 6.4 oz (73.7 kg).Body mass index is 24.69 kg/m. Review of Systems  Constitutional:  Negative for activity change, appetite change, chills, diaphoresis and fatigue.  HENT:  Negative for congestion, dental problem, drooling, ear discharge and ear pain.   Eyes:  Negative for pain, discharge and itching.  Respiratory:  Negative for apnea, cough, choking and chest tightness.   Cardiovascular:  Negative for chest pain, palpitations and leg swelling.  Gastrointestinal:  Negative for abdominal distention, abdominal pain, constipation, diarrhea and nausea.  Endocrine: Negative for cold intolerance and heat intolerance.  Genitourinary:  Negative for difficulty urinating, dysuria, flank pain, frequency, hematuria and urgency.  Musculoskeletal:  Negative for arthralgias, back pain, gait problem, joint swelling, myalgias and neck pain.  Skin:  Negative for color change and pallor.  Allergic/Immunologic: Negative for environmental allergies and food allergies.  Neurological:  Negative for dizziness, seizures, syncope, facial asymmetry, speech difficulty, light-headedness, numbness and headaches.  Psychiatric/Behavioral:  Negative for agitation, behavioral problems, confusion, decreased concentration, dysphoric mood, hallucinations, self-injury, sleep disturbance and suicidal ideas. The patient is not nervous/anxious and is not hyperactive.     General Appearance: Casual and Fairly Groomed  Eye Contact:  Good  Speech:  Clear and Coherent and Normal Rate  Volume:  Normal  Mood:  Euthymic  Affect:  Congruent  Thought Content: Logical   Suicidal  Thoughts:  No  Homicidal Thoughts:  No  Thought Process:  Coherent  Orientation:  Full (Time, Place, and Person)  Memory: Immediate;   Fair Recent;   Fair Remote;   Fair  Judgment:  Fair  Insight:  Fair  Concentration:  Concentration: Good and Attention Span: Good  Recall:  not formally assessed   Fund of Knowledge: Good  Language: NA  Psychomotor Activity:  Normal  Akathisia:  No  AIMS (if indicated): not done  Assets:  Communication Skills Desire for Improvement Financial Resources/Insurance Housing Leisure Time Social Support Transportation  ADL's:  Intact  Cognition: WNL  Sleep:  Fair    Screenings: AUDIT    Garment/textile technologist Visit from 02/19/2024 in [facility] [place] Most recent reading at 02/17/2024  1:41 PM Appointment from 11/11/2023 in [facility] [place] Most recent reading at 11/07/2023  9:25 AM Office Visit from 11/11/2023 in [facility] [place] [facility] Most recent reading at 11/07/2023  9:25 AM  Alcohol Use Disorder Identification Test Final Score (AUDIT) 4  4  4     GAD-7    Flowsheet Row Office Visit from 02/19/2024 in [facility] Visit from 11/11/2023 in [facility] Visit from 07/24/2023 in [facility] Visit from 05/13/2023 in [facility]  Total GAD-7 Score 2 4 12 4    PHQ2-9    Flowsheet Row Office Visit from 03/30/2024 in [facility] Office Visit from 02/19/2024 in [facility] [facility] Visit from 11/11/2023 in [facility] [facility] Visit from 07/24/2023 in [facility] Clinical Support from 07/09/2023 in [facility] [place]  [facility] Total Score 2 2 2 6  0  PHQ-9 Total Score 3 3 3 21  0     Collaboration of Care: Other Dr. Carvin   Patient/Guardian was advised Release of Information must be obtained prior to any record release in order to collaborate their care with an outside provider. Patient/Guardian was advised if they have not already done so to contact the registration department to sign all necessary forms in order for us  to release information regarding their care.   Consent: Patient/Guardian gives verbal consent for treatment and assignment of benefits for services provided during this visit. Patient/Guardian expressed understanding and agreed to proceed.   Kaleen Rochette, MD 9/29/20253:28 PM

## 2024-03-30 ENCOUNTER — Encounter (HOSPITAL_COMMUNITY): Payer: Self-pay

## 2024-03-30 ENCOUNTER — Ambulatory Visit (HOSPITAL_BASED_OUTPATIENT_CLINIC_OR_DEPARTMENT_OTHER): Payer: Self-pay

## 2024-03-30 VITALS — BP 113/77 | HR 76 | Ht 68.0 in | Wt 162.4 lb

## 2024-03-30 DIAGNOSIS — F4320 Adjustment disorder, unspecified: Secondary | ICD-10-CM | POA: Diagnosis not present

## 2024-03-30 NOTE — Addendum Note (Signed)
 Addended by: CARVIN CROCK on: 03/30/2024 04:17 PM   Modules accepted: Level of Service

## 2024-06-05 ENCOUNTER — Other Ambulatory Visit: Payer: Self-pay | Admitting: Cardiology

## 2024-07-09 ENCOUNTER — Ambulatory Visit (INDEPENDENT_AMBULATORY_CARE_PROVIDER_SITE_OTHER): Admitting: *Deleted

## 2024-07-09 VITALS — Ht 68.0 in | Wt 153.0 lb

## 2024-07-09 DIAGNOSIS — Z0000 Encounter for general adult medical examination without abnormal findings: Secondary | ICD-10-CM

## 2024-07-09 NOTE — Progress Notes (Signed)
 "  Chief Complaint  Patient presents with   Medicare Wellness     Subjective:   Eric Parks. is a 70 y.o. male who presents for a Medicare Annual Wellness Visit.  No voiced or noted concerns at this time Patient advised to keep follow-up appointment with PCP (08-27-2024)   Visit info / Clinical Intake: Medicare Wellness Visit Type:: Subsequent Annual Wellness Visit Persons participating in visit and providing information:: patient Medicare Wellness Visit Mode:: Telephone If telephone:: video declined Since this visit was completed virtually, some vitals may be partially provided or unavailable. Missing vitals are due to the limitations of the virtual format.: Unable to obtain vitals - no equipment If Telephone or Video please confirm:: I connected with patient using audio/video enable telemedicine. I verified patient identity with two identifiers, discussed telehealth limitations, and patient agreed to proceed. Patient Location:: home Provider Location:: home Interpreter Needed?: No Pre-visit prep was completed: no AWV questionnaire completed by patient prior to visit?: yes Date:: 07/06/24 Living arrangements:: (!) lives alone Patient's Overall Health Status Rating: good Typical amount of pain: some Does pain affect daily life?: (!) yes  Dietary Habits and Nutritional Risks How many meals a day?: 2 Eats fruit and vegetables daily?: yes Most meals are obtained by: preparing own meals In the last 2 weeks, have you had any of the following?: none Diabetic:: no  Functional Status Activities of Daily Living (to include ambulation/medication): Independent Ambulation: Independent Medication Administration: Independent Home Management (perform basic housework or laundry): Independent Manage your own finances?: yes Primary transportation is: driving Concerns about vision?: no *vision screening is required for WTM* Concerns about hearing?: no  Fall Screening Falls in  the past year?: 0 Number of falls in past year: 0 Was there an injury with Fall?: 0 Fall Risk Category Calculator: 0 Patient Fall Risk Level: Low Fall Risk  Fall Risk Patient at Risk for Falls Due to: No Fall Risks Fall risk Follow up: Falls evaluation completed; Education provided; Falls prevention discussed  Home and Transportation Safety: All rugs have non-skid backing?: yes All stairs or steps have railings?: yes Grab bars in the bathtub or shower?: (!) no Have non-skid surface in bathtub or shower?: yes Good home lighting?: yes Regular seat belt use?: yes Hospital stays in the last year:: no  Cognitive Assessment Difficulty concentrating, remembering, or making decisions? : no Will 6CIT or Mini Cog be Completed: yes What year is it?: 0 points What month is it?: 0 points Give patient an address phrase to remember (5 components): Its very sunny outside today in January About what time is it?: 0 points Count backwards from 20 to 1: 0 points Say the months of the year in reverse: 0 points Repeat the address phrase from earlier: 0 points 6 CIT Score: 0 points  Advance Directives (For Healthcare) Does Patient Have a Medical Advance Directive?: Yes Type of Advance Directive: Healthcare Power of Attorney Copy of Healthcare Power of Attorney in Chart?: No - copy requested  Reviewed/Updated  Reviewed/Updated: Surgical History; Reviewed All (Medical, Surgical, Family, Medications, Allergies, Care Teams, Patient Goals); Family History; Medications; Allergies; Care Teams; Patient Goals; Medical History    Allergies (verified) Patient has no known allergies.   Current Medications (verified) Outpatient Encounter Medications as of 07/09/2024  Medication Sig   Ascorbic Acid (VITAMIN C PO) Take 1,000 mg by mouth.   BIOTIN PO Take 5,000 mcg by mouth.   clorazepate  (TRANXENE ) 15 MG tablet Take 0.5-1 tablets (7.5-15 mg total) by mouth  2 (two) times daily as needed for anxiety or sleep.    Lutein 20 MG CAPS Take 1 tablet by mouth daily.   minoxidil (LONITEN) 2.5 MG tablet Take 5 mg by mouth daily.   Multiple Vitamin (MULTIVITAMIN) tablet Take 1 tablet by mouth daily.   olmesartan  (BENICAR ) 20 MG tablet TAKE 1 TABLET DAILY   rosuvastatin (CRESTOR) 10 MG tablet TAKE 1 TABLET DAILY   TURMERIC PO Take 1 capsule by mouth daily at 2 PM.   No facility-administered encounter medications on file as of 07/09/2024.    History: Past Medical History:  Diagnosis Date   AIN grade II    Anxiety    Depression    reactive   History of chicken pox    Hyperlipidemia    Hypertension    PSA elevation    Spinal stenosis    Past Surgical History:  Procedure Laterality Date   BACK SURGERY     foraminotomy   COLONOSCOPY     HERNIA REPAIR     RECTAL BIOPSY N/A    TONSILLECTOMY  1977   Family History  Problem Relation Age of Onset   Arthritis Father    Hyperlipidemia Father    Heart disease Father    Hypertension Father    Diabetes Father    Arthritis Mother    Colon cancer Mother 28   Stomach cancer Maternal Aunt        stomach cancer or ovarian   Melanoma Maternal Grandfather    Pancreatic cancer Neg Hx    Rectal cancer Neg Hx    Colon polyps Neg Hx    Esophageal cancer Neg Hx    Social History   Occupational History   Occupation: clinical research associate  Tobacco Use   Smoking status: Never   Smokeless tobacco: Never  Vaping Use   Vaping status: Never Used  Substance and Sexual Activity   Alcohol use: Yes    Alcohol/week: 7.0 standard drinks of alcohol    Types: 7 Standard drinks or equivalent per week    Comment: wine nightly   Drug use: Never   Sexual activity: Yes   Tobacco Counseling Counseling given: Not Answered  SDOH Screenings   Food Insecurity: No Food Insecurity (07/09/2024)  Housing: Low Risk (07/09/2024)  Transportation Needs: No Transportation Needs (07/09/2024)  Utilities: Not At Risk (07/09/2024)  Alcohol Screen: Low Risk (07/05/2024)  Depression (PHQ2-9): Low  Risk (07/09/2024)  Financial Resource Strain: Low Risk (07/05/2024)  Physical Activity: Sufficiently Active (07/09/2024)  Social Connections: Moderately Integrated (07/09/2024)  Stress: No Stress Concern Present (07/09/2024)  Tobacco Use: Low Risk (07/09/2024)  Health Literacy: Adequate Health Literacy (07/09/2024)   See flowsheets for full screening details  Depression Screen PHQ 2 & 9 Depression Scale- Over the past 2 weeks, how often have you been bothered by any of the following problems? Little interest or pleasure in doing things: 0 Feeling down, depressed, or hopeless (PHQ Adolescent also includes...irritable): 0 PHQ-2 Total Score: 0 Trouble falling or staying asleep, or sleeping too much: 0 Feeling tired or having little energy: 0 Poor appetite or overeating (PHQ Adolescent also includes...weight loss): 0 Feeling bad about yourself - or that you are a failure or have let yourself or your family down: 0 Trouble concentrating on things, such as reading the newspaper or watching television (PHQ Adolescent also includes...like school work): 0 Moving or speaking so slowly that other people could have noticed. Or the opposite - being so fidgety or restless that you have been moving  around a lot more than usual: 0 Thoughts that you would be better off dead, or of hurting yourself in some way: 0 PHQ-9 Total Score: 0 If you checked off any problems, how difficult have these problems made it for you to do your work, take care of things at home, or get along with other people?: Not difficult at all  Depression Treatment Depression Interventions/Treatment : Counseling; Medication     Goals Addressed             This Visit's Progress    Patient Stated       Find some peace             Objective:    Today's Vitals   07/09/24 1014  Weight: 153 lb (69.4 kg)  Height: 5' 8 (1.727 m)   Body mass index is 23.26 kg/m.  Hearing/Vision screen Hearing Screening - Comments:: No trouble  heaing Vision Screening - Comments:: [place] Up to date Immunizations and Health Maintenance Health Maintenance  Topic Date Due   Hepatitis C Screening  Never done   Zoster Vaccines- Shingrix (2 of 2) 02/20/2017   COVID-19 Vaccine (11 - 2025-26 season) 03/02/2024   Pneumococcal Vaccine: 50+ Years (2 of 2 - PCV) 02/18/2025 (Originally 03/26/2020)   Medicare Annual Wellness (AWV)  07/09/2025   Colonoscopy  11/02/2026   DTaP/Tdap/Td (3 - Td or Tdap) 05/09/2031   Influenza Vaccine  Completed   Meningococcal B Vaccine  Aged Out        Assessment/Plan:  This is a routine wellness examination for Ly.  Patient Care Team: Levora Reyes SAUNDERS, MD as PCP - General (Family Medicine) Ladona Heinz, MD as PCP - Cardiology (Cardiology) Moul, Thelbert Gustin, MD as Referring Physician (Urology) Monita Ford, MD as Referring Physician (Internal Medicine) Ladona Heinz, MD as Consulting Physician (Cardiology) Joshua Sieving, MD as Consulting Physician (Dermatology)  I have personally reviewed and noted the following in the patients chart:   Medical and social history Use of alcohol, tobacco or illicit drugs  Current medications and supplements including opioid prescriptions. Functional ability and status Nutritional status Physical activity Advanced directives List of other physicians Hospitalizations, surgeries, and ER visits in previous 12 months Vitals Screenings to include cognitive, depression, and falls Referrals and appointments  No orders of the defined types were placed in this encounter.  In addition, I have reviewed and discussed with patient certain preventive protocols, quality metrics, and best practice recommendations. A written personalized care plan for preventive services as well as general preventive health recommendations were provided to patient.   Zachariah Pavek, LPN   01/31/7972   Return in 1 year (on 07/09/2025).  After Visit Summary: (MyChart) Due to this being a  telephonic visit, the after visit summary with patients personalized plan was offered to patient via MyChart   Nurse Notes:  "

## 2024-07-09 NOTE — Patient Instructions (Signed)
 Eric Parks,  Thank you for taking the time for your Medicare Wellness Visit. I appreciate your continued commitment to your health goals. Please review the care plan we discussed, and feel free to reach out if I can assist you further.  Please note that Annual Wellness Visits do not include a physical exam. Some assessments may be limited, especially if the visit was conducted virtually. If needed, we may recommend an in-person follow-up with your provider.  Ongoing Care Seeing your primary care provider every 3 to 6 months helps us  monitor your health and provide consistent, personalized care.   Referrals If a referral was made during today's visit and you haven't received any updates within two weeks, please contact the referred provider directly to check on the status.  Recommended Screenings:  Health Maintenance  Topic Date Due   Hepatitis C Screening  Never done   Zoster (Shingles) Vaccine (2 of 2) 02/20/2017   COVID-19 Vaccine (11 - 2025-26 season) 03/02/2024   Pneumococcal Vaccine for age over 16 (2 of 2 - PCV) 02/18/2025*   Medicare Annual Wellness Visit  07/09/2025   Colon Cancer Screening  11/02/2026   DTaP/Tdap/Td vaccine (3 - Td or Tdap) 05/09/2031   Flu Shot  Completed   Meningitis B Vaccine  Aged Out  *Topic was postponed. The date shown is not the original due date.       07/09/2024   10:15 AM  Advanced Directives  Does Patient Have a Medical Advance Directive? Yes  Type of Advance Directive Healthcare Power of Attorney  Copy of Healthcare Power of Attorney in Chart? No - copy requested    Vision: Annual vision screenings are recommended for early detection of glaucoma, cataracts, and diabetic retinopathy. These exams can also reveal signs of chronic conditions such as diabetes and high blood pressure.  Dental: Annual dental screenings help detect early signs of oral cancer, gum disease, and other conditions linked to overall health, including heart disease and  diabetes.  Please see the attached documents for additional preventive care recommendations.    Eric Parks , Thank you for taking time to come for your Medicare Wellness Visit. I appreciate your ongoing commitment to your health goals. Please review the following plan we discussed and let me know if I can assist you in the future.   Screening recommendations/referrals: Colonoscopy:  Recommended yearly ophthalmology/optometry visit for glaucoma screening and checkup Recommended yearly dental visit for hygiene and checkup  Vaccinations: Influenza vaccine:  Pneumococcal vaccine:  Tdap vaccine:  Shingles vaccine:       Preventive Care 65 Years and Older, Male Preventive care refers to lifestyle choices and visits with your health care provider that can promote health and wellness. What does preventive care include? A yearly physical exam. This is also called an annual well check. Dental exams once or twice a year. Routine eye exams. Ask your health care provider how often you should have your eyes checked. Personal lifestyle choices, including: Daily care of your teeth and gums. Regular physical activity. Eating a healthy diet. Avoiding tobacco and drug use. Limiting alcohol use. Practicing safe sex. Taking low doses of aspirin every day. Taking vitamin and mineral supplements as recommended by your health care provider. What happens during an annual well check? The services and screenings done by your health care provider during your annual well check will depend on your age, overall health, lifestyle risk factors, and family history of disease. Counseling  Your health care provider may ask you questions  about your: Alcohol use. Tobacco use. Drug use. Emotional well-being. Home and relationship well-being. Sexual activity. Eating habits. History of falls. Memory and ability to understand (cognition). Work and work astronomer. Screening  You may have the following  tests or measurements: Height, weight, and BMI. Blood pressure. Lipid and cholesterol levels. These may be checked every 5 years, or more frequently if you are over 15 years old. Skin check. Lung cancer screening. You may have this screening every year starting at age 85 if you have a 30-pack-year history of smoking and currently smoke or have quit within the past 15 years. Fecal occult blood test (FOBT) of the stool. You may have this test every year starting at age 74. Flexible sigmoidoscopy or colonoscopy. You may have a sigmoidoscopy every 5 years or a colonoscopy every 10 years starting at age 76. Prostate cancer screening. Recommendations will vary depending on your family history and other risks. Hepatitis C blood test. Hepatitis B blood test. Sexually transmitted disease (STD) testing. Diabetes screening. This is done by checking your blood sugar (glucose) after you have not eaten for a while (fasting). You may have this done every 1-3 years. Abdominal aortic aneurysm (AAA) screening. You may need this if you are a current or former smoker. Osteoporosis. You may be screened starting at age 15 if you are at high risk. Talk with your health care provider about your test results, treatment options, and if necessary, the need for more tests. Vaccines  Your health care provider may recommend certain vaccines, such as: Influenza vaccine. This is recommended every year. Tetanus, diphtheria, and acellular pertussis (Tdap, Td) vaccine. You may need a Td booster every 10 years. Zoster vaccine. You may need this after age 29. Pneumococcal 13-valent conjugate (PCV13) vaccine. One dose is recommended after age 68. Pneumococcal polysaccharide (PPSV23) vaccine. One dose is recommended after age 75. Talk to your health care provider about which screenings and vaccines you need and how often you need them. This information is not intended to replace advice given to you by your health care provider.  Make sure you discuss any questions you have with your health care provider. Document Released: 07/15/2015 Document Revised: 03/07/2016 Document Reviewed: 04/19/2015 Elsevier Interactive Patient Education  2017 [employer].  Fall Prevention in the Home Falls can cause injuries. They can happen to people of all ages. There are many things you can do to make your home safe and to help prevent falls. What can I do on the outside of my home? Regularly fix the edges of walkways and driveways and fix any cracks. Remove anything that might make you trip as you walk through a door, such as a raised step or threshold. Trim any bushes or trees on the path to your home. Use bright outdoor lighting. Clear any walking paths of anything that might make someone trip, such as rocks or tools. Regularly check to see if handrails are loose or broken. Make sure that both sides of any steps have handrails. Any raised decks and porches should have guardrails on the edges. Have any leaves, snow, or ice cleared regularly. Use sand or salt on walking paths during winter. Clean up any spills in your garage right away. This includes oil or grease spills. What can I do in the bathroom? Use night lights. Install grab bars by the toilet and in the tub and shower. Do not use towel bars as grab bars. Use non-skid mats or decals in the tub or shower. If you need  to sit down in the shower, use a plastic, non-slip stool. Keep the floor dry. Clean up any water that spills on the floor as soon as it happens. Remove soap buildup in the tub or shower regularly. Attach bath mats securely with double-sided non-slip rug tape. Do not have throw rugs and other things on the floor that can make you trip. What can I do in the bedroom? Use night lights. Make sure that you have a light by your bed that is easy to reach. Do not use any sheets or blankets that are too big for your bed. They should not hang down onto the floor. Have a  firm chair that has side arms. You can use this for support while you get dressed. Do not have throw rugs and other things on the floor that can make you trip. What can I do in the kitchen? Clean up any spills right away. Avoid walking on wet floors. Keep items that you use a lot in easy-to-reach places. If you need to reach something above you, use a strong step stool that has a grab bar. Keep electrical cords out of the way. Do not use floor polish or wax that makes floors slippery. If you must use wax, use non-skid floor wax. Do not have throw rugs and other things on the floor that can make you trip. What can I do with my stairs? Do not leave any items on the stairs. Make sure that there are handrails on both sides of the stairs and use them. Fix handrails that are broken or loose. Make sure that handrails are as long as the stairways. Check any carpeting to make sure that it is firmly attached to the stairs. Fix any carpet that is loose or worn. Avoid having throw rugs at the top or bottom of the stairs. If you do have throw rugs, attach them to the floor with carpet tape. Make sure that you have a light switch at the top of the stairs and the bottom of the stairs. If you do not have them, ask someone to add them for you. What else can I do to help prevent falls? Wear shoes that: Do not have high heels. Have rubber bottoms. Are comfortable and fit you well. Are closed at the toe. Do not wear sandals. If you use a stepladder: Make sure that it is fully opened. Do not climb a closed stepladder. Make sure that both sides of the stepladder are locked into place. Ask someone to hold it for you, if possible. Clearly mark and make sure that you can see: Any grab bars or handrails. First and last steps. Where the edge of each step is. Use tools that help you move around (mobility aids) if they are needed. These include: Canes. Walkers. Scooters. Crutches. Turn on the lights when you  go into a dark area. Replace any light bulbs as soon as they burn out. Set up your furniture so you have a clear path. Avoid moving your furniture around. If any of your floors are uneven, fix them. If there are any pets around you, be aware of where they are. Review your medicines with your doctor. Some medicines can make you feel dizzy. This can increase your chance of falling. Ask your doctor what other things that you can do to help prevent falls. This information is not intended to replace advice given to you by your health care provider. Make sure you discuss any questions you have with your health  care provider. Document Released: 04/14/2009 Document Revised: 11/24/2015 Document Reviewed: 07/23/2014 Elsevier Interactive Patient Education  2017 [employer].

## 2024-08-27 ENCOUNTER — Ambulatory Visit: Admitting: Family Medicine

## 2024-09-24 ENCOUNTER — Ambulatory Visit: Admitting: Cardiology
